# Patient Record
Sex: Male | Born: 1955
Health system: Southern US, Community
[De-identification: ages and names within clinical notes are randomized; demographics above are authoritative.]

## PROBLEM LIST (undated history)

## (undated) DIAGNOSIS — M25569 Pain in unspecified knee: Secondary | ICD-10-CM

## (undated) DIAGNOSIS — S42109A Fracture of unspecified part of scapula, unspecified shoulder, initial encounter for closed fracture: Secondary | ICD-10-CM

## (undated) DIAGNOSIS — S2249XA Multiple fractures of ribs, unspecified side, initial encounter for closed fracture: Secondary | ICD-10-CM

## (undated) DIAGNOSIS — G8929 Other chronic pain: Secondary | ICD-10-CM

## (undated) DIAGNOSIS — R972 Elevated prostate specific antigen [PSA]: Secondary | ICD-10-CM

## (undated) DIAGNOSIS — S2239XA Fracture of one rib, unspecified side, initial encounter for closed fracture: Secondary | ICD-10-CM

## (undated) DIAGNOSIS — J939 Pneumothorax, unspecified: Secondary | ICD-10-CM

## (undated) HISTORY — DX: Fracture of one rib, unspecified side, initial encounter for closed fracture: S22.39XA

## (undated) HISTORY — DX: Multiple fractures of ribs, unspecified side, initial encounter for closed fracture: S22.49XA

## (undated) HISTORY — DX: Pain in unspecified knee: M25.569

## (undated) HISTORY — DX: Elevated prostate specific antigen (PSA): R97.20

## (undated) HISTORY — PX: KNEE SURGERY: SHX244

## (undated) HISTORY — DX: Other chronic pain: G89.29

## (undated) HISTORY — DX: Pneumothorax, unspecified: J93.9

## (undated) HISTORY — DX: Fracture of unspecified part of scapula, unspecified shoulder, initial encounter for closed fracture: S42.109A

---

## 2000-10-28 ENCOUNTER — Encounter: Payer: Self-pay | Admitting: Orthopedic Surgery

## 2000-10-29 ENCOUNTER — Inpatient Hospital Stay (HOSPITAL_COMMUNITY): Admission: RE | Admit: 2000-10-29 | Discharge: 2000-10-30 | Payer: Self-pay | Admitting: Orthopedic Surgery

## 2005-06-19 ENCOUNTER — Ambulatory Visit: Payer: Self-pay | Admitting: Family Medicine

## 2006-05-05 ENCOUNTER — Inpatient Hospital Stay (HOSPITAL_COMMUNITY): Admission: EM | Admit: 2006-05-05 | Discharge: 2006-05-07 | Payer: Self-pay | Admitting: Emergency Medicine

## 2007-09-21 ENCOUNTER — Encounter: Payer: Self-pay | Admitting: Family Medicine

## 2007-09-30 ENCOUNTER — Ambulatory Visit: Payer: Self-pay | Admitting: Family Medicine

## 2007-09-30 DIAGNOSIS — J301 Allergic rhinitis due to pollen: Secondary | ICD-10-CM

## 2007-09-30 DIAGNOSIS — M1710 Unilateral primary osteoarthritis, unspecified knee: Secondary | ICD-10-CM | POA: Insufficient documentation

## 2007-10-22 ENCOUNTER — Ambulatory Visit: Payer: Self-pay | Admitting: Family Medicine

## 2007-10-22 LAB — CONVERTED CEMR LAB
OCCULT 2: NEGATIVE
OCCULT 3: NEGATIVE

## 2007-10-26 ENCOUNTER — Encounter: Payer: Self-pay | Admitting: Family Medicine

## 2008-02-17 IMAGING — CR DG CHEST 1V PORT
1 series · 1 of 1 positions shown · non-contrast
Comparison: none

CLINICAL DATA: Fall.  Shortness of breath and left-sided pain.
PORTABLE CHEST - 1 VIEW - 05/05/06 AT 7187 HOURS:

[view not recorded]
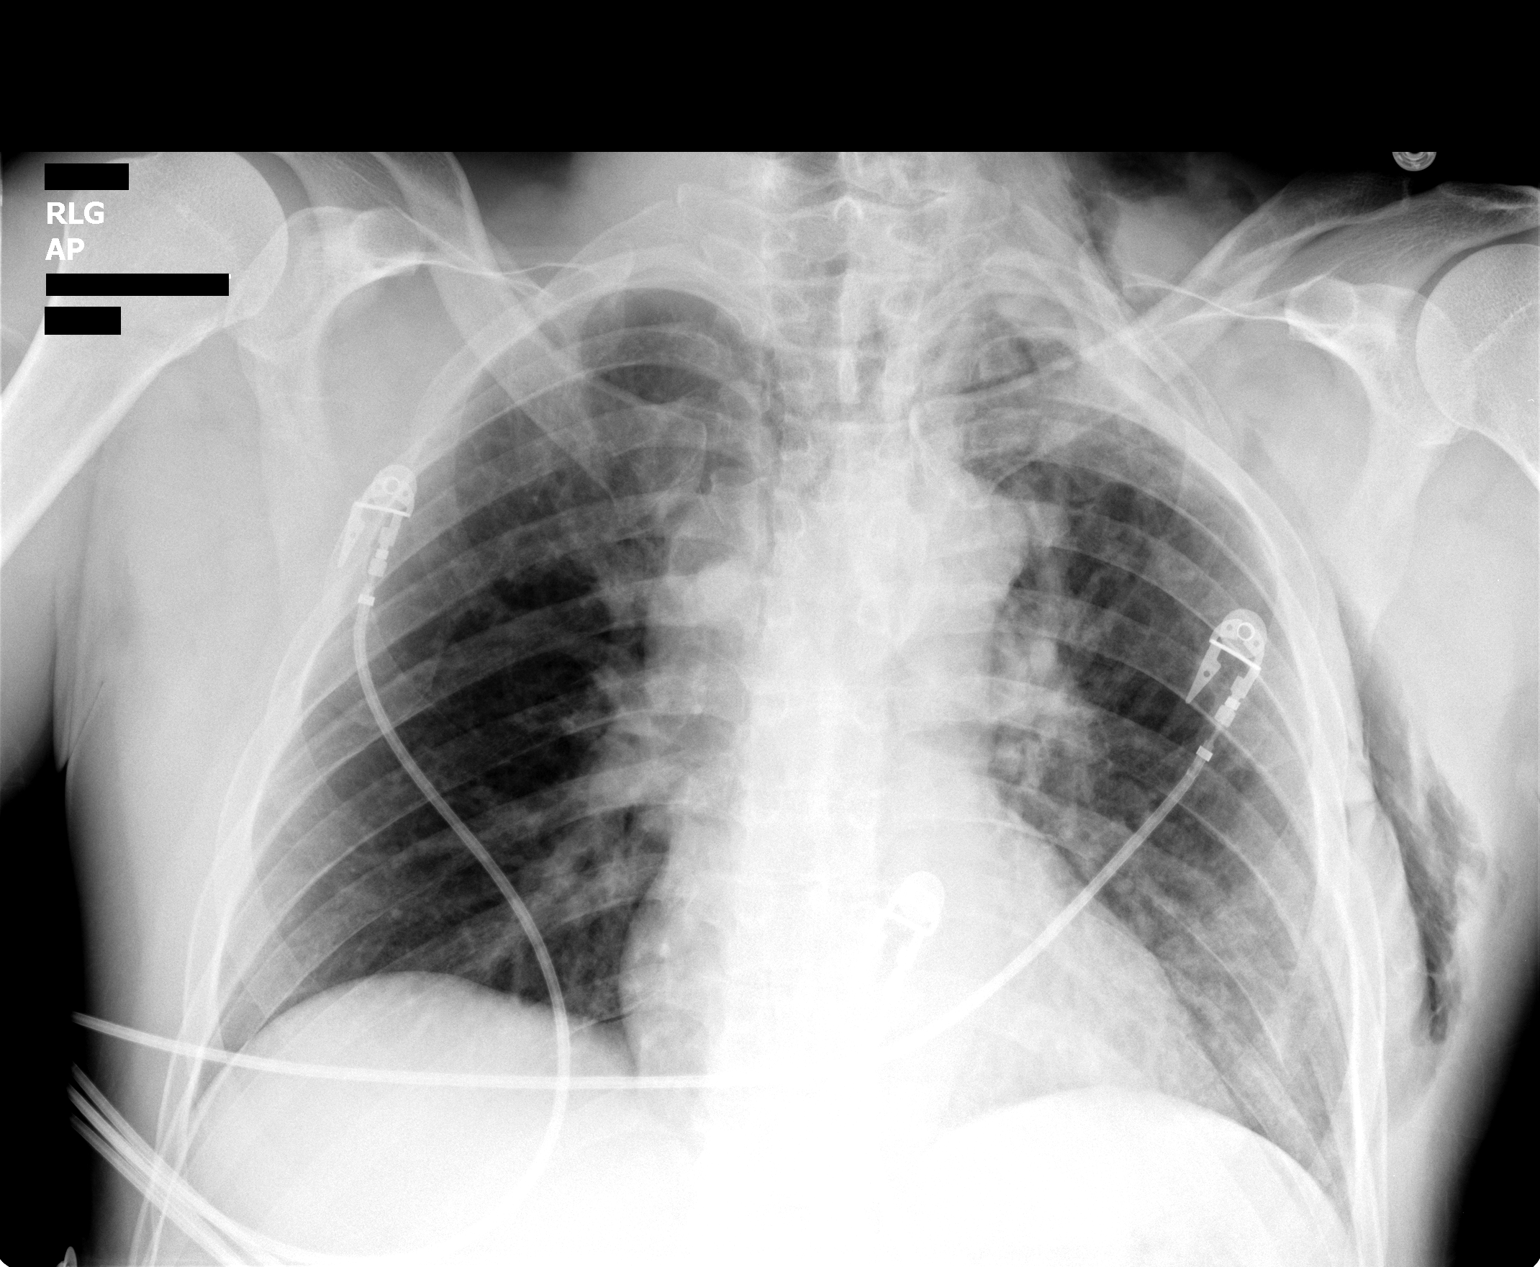

[1 of 1 positions shown; findings below may reference images not displayed]

FINDINGS: The patient has been removed from the trauma backboard.  Again noted is a large amount of left subcutaneous gas and left rib fractures.  A large pneumothorax is not appreciated.  There is density in the left lung apex probably related to some fluid.  The mediastinum has somewhat of an indistinct appearance and it is difficult to exclude a mediastinal injury.  Subcutaneous gas extends into the left side of the neck.
The right lung remains clear.  The heart size is stable.
IMPRESSION: Post-traumatic changes along the left hemithorax consistent with rib fractures with a large amount of subcutaneous gas.  In addition, there is density in the left apex thought to be related to pleural fluid.  There is slight widening of the superior mediastinum and a mediastinal hematoma cannot be excluded.  Recommend further evaluation with chest CT with contrast.

## 2008-02-17 IMAGING — CT CT ABDOMEN W/ CM
3 of 7 series · 11 of 32 positions shown, 16 images · IV contrast (100 ML OMNI 300)
Comparison: none

CLINICAL DATA: Fall with chest trauma.
CHEST CT WITH CONTRAST ? 05/05/06:
TECHNIQUE: Multidetector CT imaging of the chest was performed following the standard protocol during bolus administration of intravenous contrast.   
Contrast:  100 cc Omnipaque 300 IV.
TECHNIQUE: Multidetector CT imaging of the abdomen was performed following the standard protocol during bolus administration of intravenous contrast.
TECHNIQUE: Multidetector CT imaging of the pelvis was performed following the standard protocol during bolus administration of intravenous contrast.

[Series 400: reformatted · sagittal · 0.86mm/px · 4 of 148 slices shown, 9 images (1 of 3)]
[im 30/148  soft-tissue]
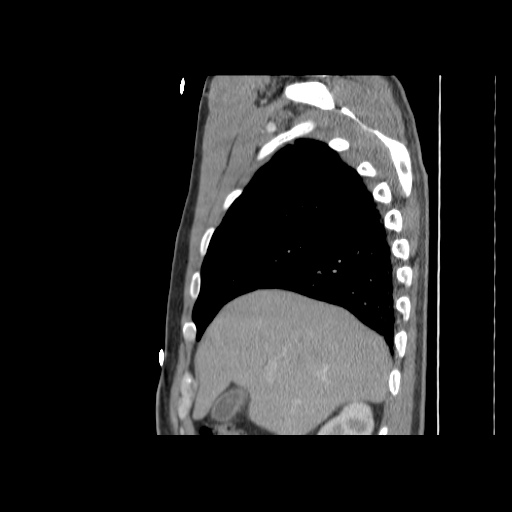
[im 30/148  lung]
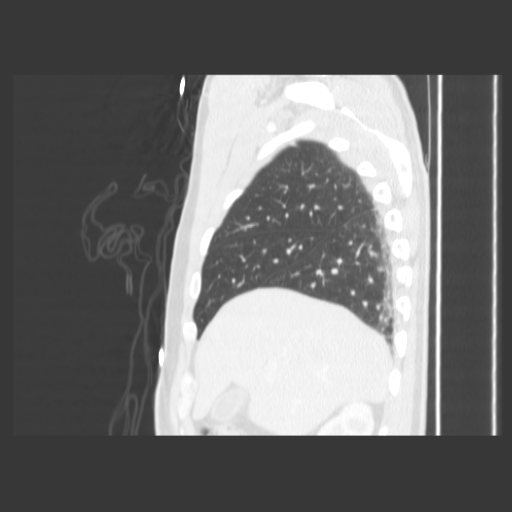
[im 30/148  bone]
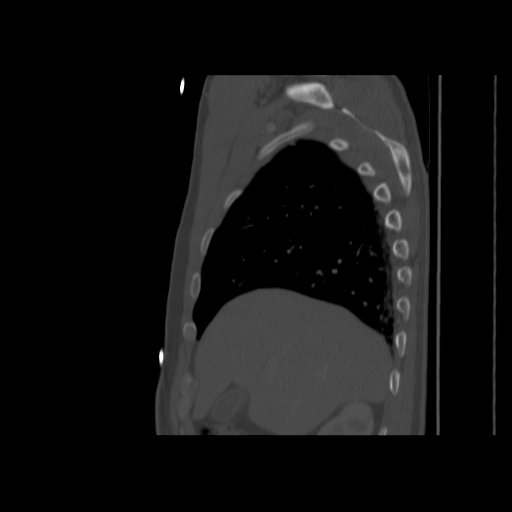
[im 59/148  soft-tissue]
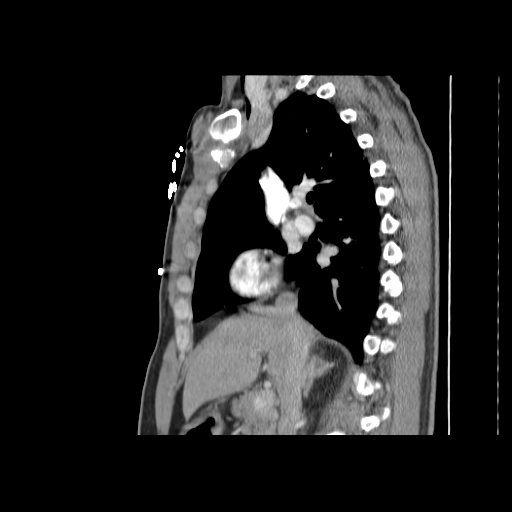
[im 59/148  lung]
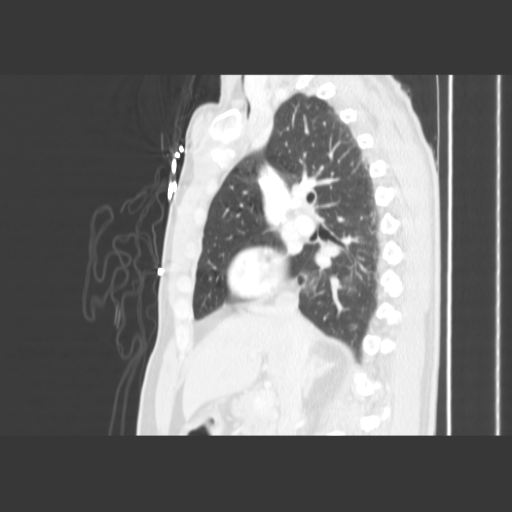
[im 89/148  soft-tissue]
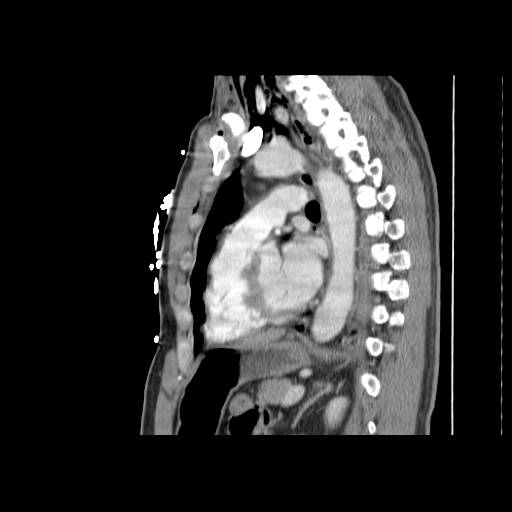
[im 89/148  lung]
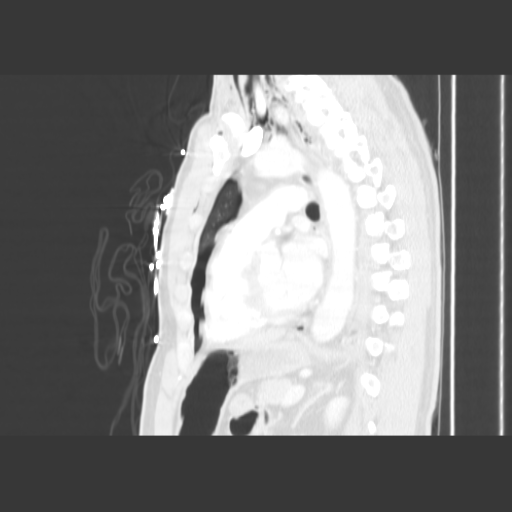
[im 118/148  soft-tissue]
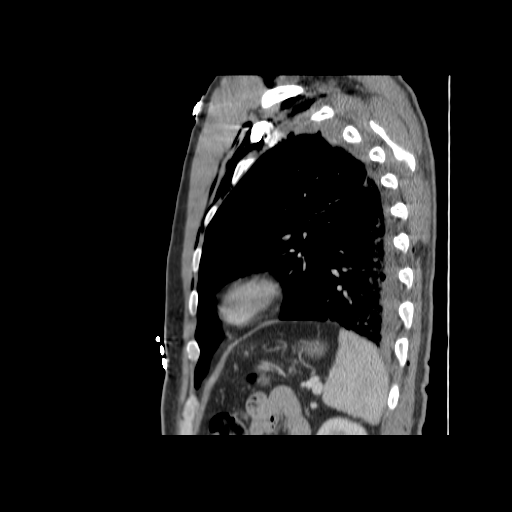
[im 118/148  lung]
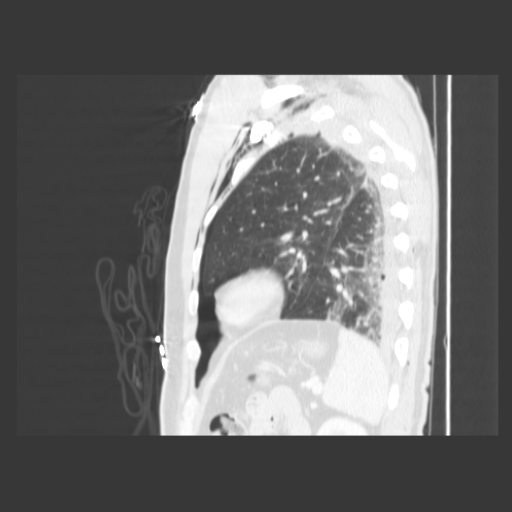

[Series 401: reformatted · coronal · 0.86mm/px · 3 of 117 slices shown (2 of 3)]
[im 30/117  soft-tissue]
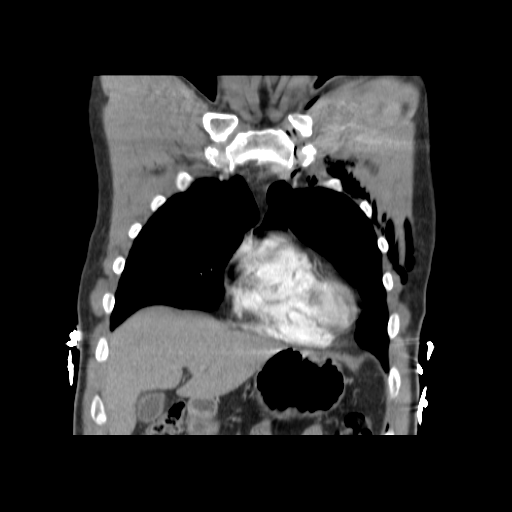
[im 59/117  soft-tissue]
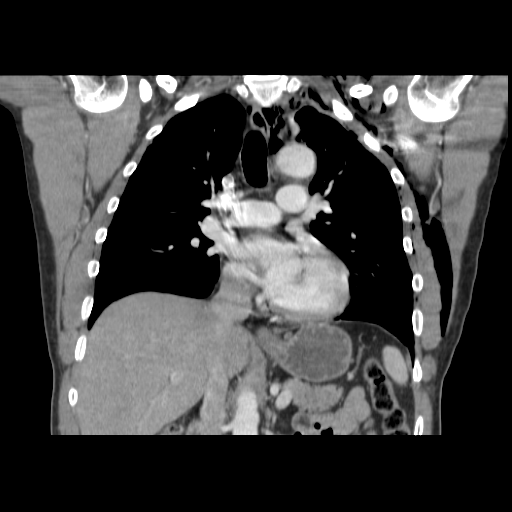
[im 88/117  soft-tissue]
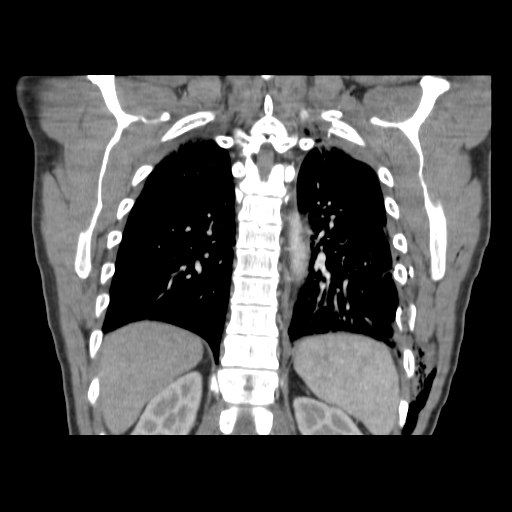

[Series 402: reformatted · sagittal · 0.86mm/px · 4 of 148 slices shown (3 of 3)]
[im 30/148  soft-tissue]
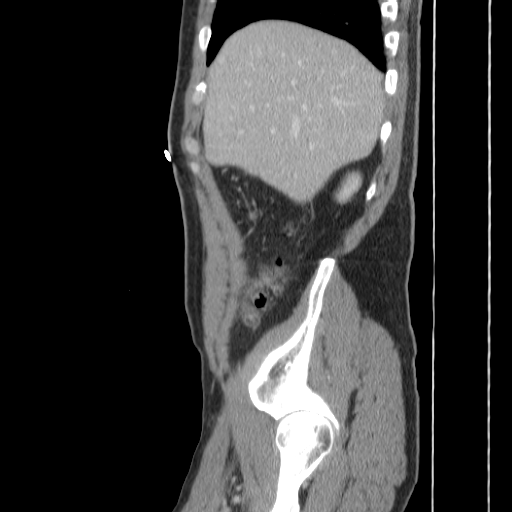
[im 59/148  soft-tissue]
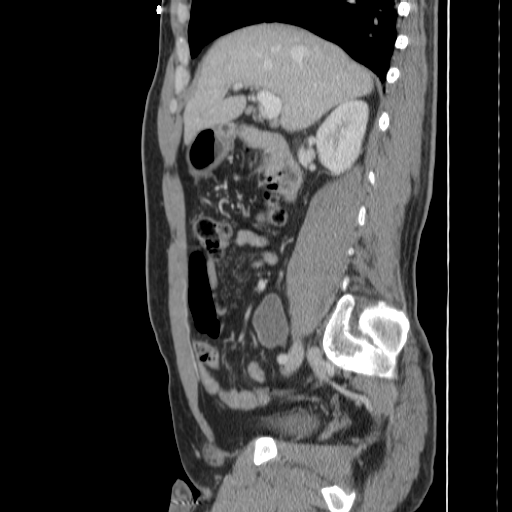
[im 89/148  soft-tissue]
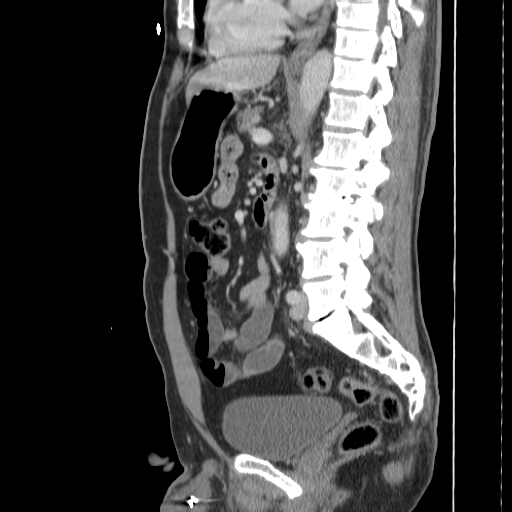
[im 118/148  soft-tissue]
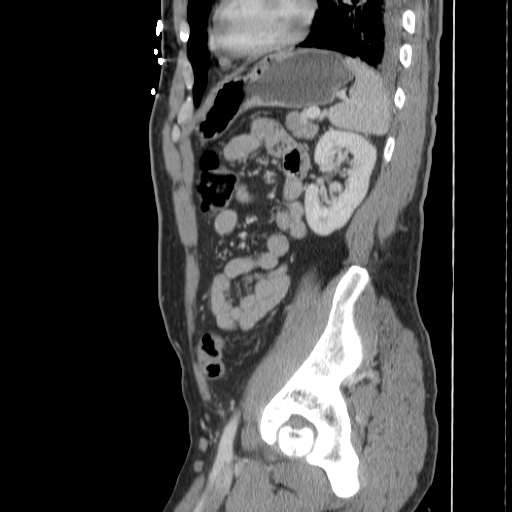

[11 of 32 positions shown; findings below may reference images not displayed]

FINDINGS: There is a large amount of subcutaneous gas similar to the recent chest radiographs.  There is no evidence for a mediastinal hematoma.  There is a small amount of fluid in the superior pericardial recess.  Incidentally, the left vertebral artery originates from the arch which is a normal variant.  There is normal caliber of the pulmonary arteries bilaterally.  There is a small amount of left pleural fluid.  There are fractures involving the left second, third, fourth, sixth, seventh, eighth, and probably ninth rib.  
Lung windows demonstrate a large amount of subcutaneous as well as mediastinal gas.  There is a small anterior pneumothorax in the left midlung as well as the lingular region.  There is volume loss involving the left lower lobe.  There is a focal area of air space disease in the anterior left upper lung on sequence 3, image #22 with central area of cavitation.  The finding is concerning for a contusion with an additional small area along the periphery of the left upper lung.  There is a punctate 2-3 mm nodular density in the right lung on sequence, image #24.  There is a fracture involving the left scapular body which is minimally displaced.  The left glenohumeral joint is intact.  No acute vertebral abnormalities.  The sternum is intact.
IMPRESSION: 1.  Extensive trauma to the left hemithorax.  The patient has multiple left rib fractures as well as a left scapular fracture.  Extensive subcutaneous and mediastinal gas.  There is a small anterior pneumothorax as well.  There is a small amount of fluid in the mediastinum thought to be related to pericardial recess and no evidence for mediastinal hematoma.
2.  Focal air space density with a central area of lucency in the left upper lung.  This is most likely post-traumatic in nature but recommend a follow-up CT in a few weeks to exclude an underlying cavitary lesion in this area.
ABDOMEN CT WITH CONTRAST ? 05/05/06:
FINDINGS: The liver, spleen, pancreas, adrenal glands, kidneys, and stomach have a normal appearance.  There are some densities within the gallbladder and cannot exclude stones or sludge.  There is no evidence for gallbladder distention. The portal venous system is patent.  Incidentally, the patient has a left circumaortic renal vein.  Small and large bowel structures are within normal limits.  No significant mesenteric injury is appreciated.  No acute bone abnormalities.
IMPRESSION: No acute findings in the abdomen.
PELVIS CT WITH CONTRAST ? 05/05/06:
FINDINGS: The urinary bladder is distended.  Calcifications associated with the prostate gland.  Normal appearance of the distal colon.  No significant free fluid or lymphadenopathy.  There are no acute bone abnormalities.  There is calcification or high-density structure in the anterior aspect of the thigh which is indeterminate.
IMPRESSION: No acute findings in the pelvis.

## 2009-09-25 ENCOUNTER — Ambulatory Visit: Payer: Self-pay | Admitting: Family Medicine

## 2009-10-02 ENCOUNTER — Ambulatory Visit: Payer: Self-pay | Admitting: Family Medicine

## 2009-10-02 LAB — CONVERTED CEMR LAB
AST: 21 units/L (ref 0–37)
Albumin: 4 g/dL (ref 3.5–5.2)
BUN: 17 mg/dL (ref 6–23)
CO2: 30 meq/L (ref 19–32)
Calcium: 8.9 mg/dL (ref 8.4–10.5)
Chloride: 105 meq/L (ref 96–112)
Creatinine, Ser: 0.9 mg/dL (ref 0.4–1.5)
GFR calc non Af Amer: 99.26 mL/min (ref >60)
Glucose, Bld: 103 mg/dL — ABNORMAL HIGH (ref 70–99)
HCT: 44.3 % (ref 39.0–52.0)
Hemoglobin: 15.3 g/dL (ref 13.0–17.0)
MCV: 97.1 fL (ref 78.0–100.0)
Monocytes Absolute: 0.6 10*3/uL (ref 0.1–1.0)
Monocytes Relative: 13.4 % — ABNORMAL HIGH (ref 3.0–12.0)
Neutro Abs: 2.1 10*3/uL (ref 1.4–7.7)
Neutrophils Relative %: 43.7 % (ref 43.0–77.0)
Platelets: 175 10*3/uL (ref 150.0–400.0)
Potassium: 5 meq/L (ref 3.5–5.1)
Protein, U semiquant: NEGATIVE
Sodium: 140 meq/L (ref 135–145)
Specific Gravity, Urine: 1.02
TSH: 2.12 microintl units/mL (ref 0.35–5.50)
Total Bilirubin: 1.3 mg/dL — ABNORMAL HIGH (ref 0.3–1.2)
Triglycerides: 87 mg/dL (ref 0.0–149.0)
Urobilinogen, UA: 0.2
VLDL: 17.4 mg/dL (ref 0.0–40.0)
pH: 8.5

## 2010-06-02 LAB — CONVERTED CEMR LAB
AST: 22 units/L (ref 0–37)
Alkaline Phosphatase: 76 units/L (ref 39–117)
BUN: 21 mg/dL (ref 6–23)
Basophils Absolute: 0 10*3/uL (ref 0.0–0.1)
Basophils Relative: 0.6 % (ref 0.0–1.0)
Bilirubin Urine: NEGATIVE
Calcium: 9.1 mg/dL (ref 8.4–10.5)
Chloride: 104 meq/L (ref 96–112)
Creatinine, Ser: 0.9 mg/dL (ref 0.4–1.5)
Eosinophils Absolute: 0.1 10*3/uL (ref 0.0–0.7)
Eosinophils Relative: 1.6 % (ref 0.0–5.0)
Glucose, Bld: 98 mg/dL (ref 70–99)
Glucose, Urine, Semiquant: NEGATIVE
HCT: 44.1 % (ref 39.0–52.0)
Hemoglobin: 14.8 g/dL (ref 13.0–17.0)
MCHC: 33.6 g/dL (ref 30.0–36.0)
MCV: 97.2 fL (ref 78.0–100.0)
Monocytes Absolute: 0.6 10*3/uL (ref 0.1–1.0)
Monocytes Relative: 13.5 % — ABNORMAL HIGH (ref 3.0–12.0)
Neutro Abs: 2.3 10*3/uL (ref 1.4–7.7)
Neutrophils Relative %: 46.2 % (ref 43.0–77.0)
Nitrite: NEGATIVE
PSA: 0.71 ng/mL (ref 0.10–4.00)
RDW: 11.5 % (ref 11.5–14.6)
Specific Gravity, Urine: 1.01
Total Bilirubin: 1.3 mg/dL — ABNORMAL HIGH (ref 0.3–1.2)
Total CHOL/HDL Ratio: 3.7
Triglycerides: 33 mg/dL (ref 0–149)
Urobilinogen, UA: NEGATIVE
pH: 5

## 2010-06-04 NOTE — Assessment & Plan Note (Signed)
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 Colonoscopic exam in 2007 to be repeated in 10 years immunizations are up-to-date no cardiac symptoms has some past history of allergic rhinitis early with his Allegra  EKG  Procedure date:  10/02/2009  Findings:      sinus rhythm with rate of:  63 Sinus Rhythm Normal ECG   Allergies: 1)  ! Sulfa  Past History:  Past Medical History: Last updated: 09/30/2007 rt knee problems  left rib fractures  small left pneumothorax  left scapula fracture  Past Surgical History: Last updated: 09/30/2007 rt knee surgery --dr frank aluisio  Social History: Last updated: 09/30/2007 Occupation: general contractor   Risk Factors: Smoking Status: never (09/30/2007)  Review of Systems      See HPI General:  Denies chills, fatigue, fever, loss of appetite, malaise, sleep disorder, sweats, weakness, and weight loss. Eyes:  Denies blurring, discharge, double vision, eye irritation, eye pain, halos, itching, light sensitivity, red eye, vision loss-1 eye, and vision loss-both eyes; classes. ENT:  Denies decreased hearing, difficulty swallowing, ear discharge, earache, hoarseness, nasal congestion, nosebleeds, postnasal drainage, ringing in ears, sinus pressure, and sore throat. CV:  Denies bluish discoloration of lips or nails, chest pain or  discomfort, difficulty breathing at night, difficulty breathing while lying down, fainting, fatigue, leg cramps with exertion, lightheadness, near fainting, palpitations, shortness of breath with exertion, swelling of feet, swelling of hands, and weight gain. Resp:  Denies chest discomfort, chest pain with inspiration, cough, coughing up blood, excessive snoring, hypersomnolence, morning headaches, pleuritic, shortness of breath, sputum productive, and wheezing. GI:  Denies abdominal pain, bloody stools, change in bowel habits, constipation, dark tarry stools, diarrhea, excessive appetite, gas, hemorrhoids, indigestion, loss of appetite, nausea, vomiting, vomiting blood, and yellowish skin color. GU:  Denies decreased libido, discharge, dysuria, erectile dysfunction, genital sores, hematuria, incontinence, nocturia, urinary frequency, and urinary hesitancy. MS:  Complains of joint pain; right knee partial replacement some arthritic symptoms. Derm:  Denies changes in color of skin, changes in nail beds, dryness, excessive perspiration, flushing, hair loss, insect bite(s), itching, lesion(s), poor wound healing, and rash. Neuro:  Denies brief paralysis, difficulty with concentration, disturbances in coordination, falling down, headaches, inability to speak, memory loss, numbness, poor balance, seizures, sensation of room spinning, tingling, tremors, visual disturbances, and weakness. Psych:  Denies alternate hallucination ( auditory/visual), anxiety, depression, easily angered, easily tearful, irritability, mental problems, panic attacks, sense of great danger, suicidal thoughts/plans, thoughts of violence, unusual visions or sounds, and thoughts /plans of harming others.  Physical Exam  General:  Well-developed,well-nourished,in no acute distress; alert,appropriate and cooperative throughout examination Head:  Normocephalic and atraumatic without obvious abnormalities. No apparent alopecia or  balding.  Eyes:  No corneal or conjunctival inflammation noted. EOMI. Perrla. Funduscopic exam benign, without hemorrhages, exudates or papilledema. Vision grossly normal. Ears:  External ear exam shows no significant lesions or deformities.  Otoscopic examination reveals clear canals, tympanic membranes are intact bilaterally without bulging, retraction, inflammation or discharge. Hearing is grossly normal bilaterally. Nose:  External nasal examination shows no deformity or inflammation. Nasal mucosa are pink and moist without lesions or exudates. Mouth:  Oral mucosa and oropharynx without lesions or exudates.  Teeth in good repair. Neck:  No deformities, masses, or tenderness noted. Chest Wall:  No deformities, masses, tenderness or gynecomastia noted. Breasts:  No masses or gynecomastia noted Lungs:  Normal respiratory effort, chest expands symmetrically. Lungs are clear to auscultation, no crackles or wheezes. Heart:  Normal rate and regular rhythm. S1 and S2 normal without gallop, murmur, click, rub or other extra sounds. Abdomen:  Bowel sounds positive,abdomen soft and non-tender without masses, organomegaly or hernias noted. Rectal:  No external abnormalities noted. Normal sphincter tone. No rectal masses or tenderness. Genitalia:  Testes bilaterally descended without nodularity, tenderness or masses. No scrotal masses or lesions. No penis lesions or urethral discharge. Prostate:  Prostate gland firm and smooth, no enlargement, nodularity, tenderness, mass, asymmetry or induration. Msk:  operative scar on the right knee but no tenderness at this time no limitation of movement Pulses:  R and L carotid,radial,femoral,dorsalis pedis and posterior tibial pulses are full and equal bilaterally Extremities:  No clubbing, cyanosis, edema, or deformity noted with normal full range of motion of all joints.   Neurologic:  No cranial nerve deficits noted. Station and gait are normal. Plantar reflexes are  down-going bilaterally. DTRs are symmetrical throughout. Sensory, motor and coordinative functions appear intact. Skin:  Intact without suspicious lesions or rashes Cervical Nodes:  No lymphadenopathy noted Axillary Nodes:  No palpable lymphadenopathy Inguinal Nodes:  No significant adenopathy Psych:  Cognition and judgment appear intact. Alert and cooperative with normal attention span and concentration. No apparent delusions, illusions, hallucinations   Impression & Recommendations:  Problem # 1:  PHYSICAL EXAMINATION (ICD-V70.0) Assessment Improved  Problem # 2:  DEGENERATIVE JOINT DISEASE, RIGHT KNEE (ICD-715.96) Assessment: Improved  His updated medication list for this problem includes:    Diclofenac Sodium 75 Mg Tbec (Diclofenac sodium) .Marland Kitchen... 1  two times a day for arthritis    Baby Aspirin 81 Mg Chew (Aspirin)  Problem # 3:  ALLERGIC RHINITIS DUE TO POLLEN (ICD-477.0) Assessment: Improved  Complete Medication List: 1)  Diclofenac Sodium 75 Mg Tbec (Diclofenac sodium) .Marland Kitchen.. 1  two times a day for arthritis 2)  Baby Aspirin 81 Mg Chew (Aspirin) 3)  Fish Oil 1000 Mg Caps (Omega-3 fatty acids) .... Once daily 4)  Allegra 180 Mg Tabs (Fexofenadine hcl) .Marland Kitchen.. 1 qd  Patient Instructions: 1)  physical examination reveals no abnormalities except for the past history of partial knee replacement on the right 2)  Usual laboratory allergic rhinitis when needed 3)  Continue to take diclofenac 75 mg A.m. and p.m. 4)  Continued daily aspirin 81 mg 5)  as related to use the laboratory studies were all within normal limits 6)

## 2010-09-20 NOTE — Op Note (Signed)
[facility]  Patient:    [name], [name]                        MRN: [id number] Proc. Date: 10/28/00 Adm. Date:  [id number] Attending:  Ollen Gross V                           Operative Report  PREOPERATIVE DIAGNOSIS:  Medial compartment arthritis, right knee.  POSTOPERATIVE DIAGNOSIS:  Medial compartment arthritis, right knee.  PROCEDURE:  Right knee arthroscopy with insertion of unispacer.  SURGEON:  Dr. Lequita Halt.  ASSISTANT:  Haze Rushing, P.A.-C.  ANESTHESIA:  General.  ESTIMATED BLOOD LOSS:  100  DRAINS:  Hemovac x 1.  COMPLICATIONS:  None.  TOURNIQUET TIME:  75 minutes at 350 mmHg.  CONDITION:  Stable to recovery.  BRIEF CLINICAL NOTE:  [name] is a 55 year old male with significant medial compartment arthritis of the right knee with intact ACL and uninvolved lateral and patellofemoral compartments. He has got pain refractory to nonoperative management and presents now for the above mentioned procedure.  DESCRIPTION OF PROCEDURE:  After successful administration of general anesthetic, a tourniquet was placed high on the right thigh and right lower extremity prepped and draped in the usual sterile fashion. The standard superomedial and inferolateral portal incisions are made, inflow cannula passed superomedially and the cannula for the camera inferolaterally. Arthroscopic visualization proceeds. He had a lot of synovitis and inflamed tissue. It was felt at this point for better visualization that we would inflate the tourniquet. The leg was elevated and the tourniquet was inflated to 350 mmHg. The undersurface of the patella was then visualized as well as the trochlea and both appeared normal. The medial and lateral gutters looked normal. Flexion and valgus force is applied to the knee and the medial compartment entered. He had a small meniscal remnant but had bone on bone change throughout the medial femoral condyle and medial tibial  plateau. There was minimal remaining cartilage. The intercondylar notch is then visualized and the ACL appears and probes normally. The lateral compartment is entered and looks normal. At this time, the remaining cartilage on the surface of the tibia is removed with a wooden handle periosteal elevator. The femur is also smoothed with the shaver and elevator. The oblator is then used to remove the meniscal remnant. The arthroscopic equipment was then removed from the joint and medial parapatellar incision made from the inferior pole of patella down medial to the tibial tubercle. The skin was cut with a 10 blade in the subcutaneous tissue and then an arthrotomy is made. The soft tissue over the proximal and medial tibial is elevated to the joint line with the knife and at the semimembranosus bursa with the curved osteotome. Exposure was obtained and then the marginal osteophytes removed off the tibia as well as femur. The remaining cartilage is then smoothed down to the bony surface and all roughened edges are smoothed to create a nice gliding surface on both the femur and the tibia. Once its felt that the cartilage has been appropriately contoured then the device is trialed. The depth gauge is used to measure from posterior to anterior tibia and its 50 to 51 mm. We trial a 50 first with a 3 mm thickness. It was noted on the lateral fluoroscopy that this was not long enough thus we went to a 54 x 3. This has a great fit. I achieved full  extension and I was able to flex him down to 135. He had excellent stability. It was gliding smoothly and there was no "bump" shift. The cartilage was thus well molded. At this point, the trial is removed and the wound copiously irrigated as was the joint copiously irrigated with antibiotic solution. A permanent 54 mm unispacer with 3 mm thickness is then placed into the medial compartment of the knee. We placed it through a range of motion and did an anterior  posterior stability test and it was found to be very stable. We then viewed it under AP fluoroscopy which showed a great fit and then lateral fluoroscopy which showed it was tracking very well at the femur. There was no evidence of any subluxation or abnormal jumping phenomenon. The wound is again copiously irrigated with antibiotic solution and the arthrotomy closed over a Hemovac drain with interrupted #1 PDS. The tourniquet was released with a total time of 75 minutes. The subcu was closed with interrupted 2-0 Vicryl, subcuticular with running 4-0 monocryl. The incision was clean and dry and Steri-Strips. A bulky sterile dressing applied. Prior to this, the subcu had been infiltrated with 10 cc of 0.25% Marcaine with epinephrine and the portals were closed with nylon. After the bulky sterile dressing was applied, drains hooked to suction, patient awakened and placed into a knee immobilizer and transported to recovery in stable condition.DD:  10/28/00 TD:  10/28/00 Job: [id number]

## 2010-09-20 NOTE — Discharge Summary (Signed)
[name], [name] NO.:  [id number]   MEDICAL RECORD NO.:  [id number]          PATIENT TYPE:  INP   LOCATION:  5004                         FACILITY:  [facility]   PHYSICIAN:  Cherylynn Ridges, M.D.    DATE OF BIRTH:  07/07/1955   DATE OF ADMISSION:  05/05/2006  DATE OF DISCHARGE:  05/07/2006                               DISCHARGE SUMMARY   DISCHARGE DIAGNOSES:  1. Fall.  2. Multiple left-sided rib fractures.  3. Left pneumothorax.  4. Left chest subcutaneous emphysema.  5. Acute blood loss anemia.  6. Left scapula fracture   CONSULTANTS:  Lubertha Basque. Jerl Santos, M.D., for orthopedic surgery.   PROCEDURES:  None.   HISTORY OF PRESENT ILLNESS:  This is a 55 year old white male who fell  from a scaffold approximately 8 feet onto the ground.  There was no loss  of consciousness.  He comes in as a non-trauma code complaining of left-  sided chest pain.  His workup demonstrated the above-mentioned  orthopedic injuries, and he was admitted for observation and pain  control.   HOSPITAL COURSE:  The patient's hospital course was uneventful.  He had  significantly less pain from his fractures as was expected and was able  to be controlled with oral pain medication and sent home in good  condition in the care of his wife.  His scapular fracture was treated  nonoperatively in a sling.  The patient expressed a desire to see his  own orthopedic surgeon upon discharge.   DISCHARGE MEDICATIONS:  Percocet 5/325 take one to two p.o. q.4h p.r.n.  pain, #60 with no refill.   FOLLOW-UP:  The patient will follow up with Dr. Lequita Halt within1-2 weeks.  Follow-up with the trauma service will be on an as-needed basis.  If he  has any questions or concerns, he should feel free to call us.      Earney Hamburg, P.A.      Cherylynn Ridges, M.D.  Electronically Signed    MJ/MEDQ  D:  05/07/2006  T:  05/07/2006  Job:  [id number]   cc:   Ollen Gross, M.D.  Lubertha Basque Jerl Santos, M.D.

## 2010-09-20 NOTE — H&P (Signed)
[name], [name] NO.:  [id number]   MEDICAL RECORD NO.:  [id number]          PATIENT TYPE:  EMS   LOCATION:  MAJO                         FACILITY:  [facility]   PHYSICIAN:  Gabrielle Dare. Janee Morn, M.D.DATE OF BIRTH:  25-Feb-1956   DATE OF ADMISSION:  05/05/2006  DATE OF DISCHARGE:                              HISTORY & PHYSICAL   CHIEF COMPLAINT:  Left chest pain and left back pain status post fall.   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  He fell approximately 8 feet onto the ground.  He had  no loss of consciousness.  He came as a non-trauma code activation.  He  complains of left lateral chest pain and pain in his left back.  Workup  in the emergency department shows multiple left rib fractures, a small  left pneumothorax on CT only, and a left scapula fracture.  We are asked  to admit to the trauma service.   PAST MEDICAL HISTORY:  Right knee problems following an injury during  football in his youth.   PAST SURGICAL HISTORY:  Right knee surgery x2 by Dr. Ollen Gross.   SOCIAL HISTORY:  Does not do illicit drugs.  He does not smoke.  He  drinks one glass of wine daily.   ALLERGIES:  SULFA.   MEDICATIONS:  1. Diclofenac.  2. aspirin 81 mg daily.  3. Fish oil supplement.   PRIMARY MEDICAL DOCTOR:  Dr. Cecille Rubin.   REVIEW OF SYSTEMS:  A 15 system review was completed and is negative  with the exception of the following.  PULMONARY EXAM:  He has some pain  with deep inspiration along his left chest.  MUSCULOSKELETAL EXAM:  Has  the left chest discomfort and left back discomfort.   PHYSICAL EXAM:  VITAL SIGNS:  Temperature 96.9, pulse 58, respirations  20, blood pressure 119/83, saturations 91-98% on room air.  SKIN:  Warm and dry with no rashes.  HEENT:  Normocephalic and atraumatic.  Eyes: Pupils are 3 mm and equal  and  reactive.  His left iris primarily in the area inferior to his pupil  has a discoloration, the patient claims this has been present since  birth.  Ears are clear.  Face is atraumatic.  NECK:  Supple with no tenderness, there is no step-offs.  No masses are  felt.  LUNGS:  Clear to auscultation.  Respiratory excursion is decent.  He has  some tenderness along his left lateral rib area with some mild  subcutaneous emphysema.  CARDIOVASCULAR EXAM:  Heart is regular, rate is approximately 60, distal  pulses are 2+ throughout with no peripheral edema. ABDOMEN:  Soft and  nontender.  Bowel sounds are present.  No organomegaly or masses are  noted on palpation.  Pelvis is stable anteriorly.  MUSCULOSKELETAL EXAM:  Shows no significant bony deformity or tenderness  in the extremities.  Back has some tenderness over his left scapula but  no tenderness  along the midline or step-offs and along the spine.  NEUROLOGIC EXAM:  Glasgow coma scale is 15.  He is moving all  extremities well.  Left leg touch is intact grossly in all four  extremities.   LABORATORY STUDIES:  Sodium 138, potassium 3.9, chloride 104, CO2 29,  BUN 14, creatinine 1, glucose 130.  White blood cell count 13.8,  hemoglobin 14.2, platelets 168,000.  Urinalysis negative.  CHEST X-RAY:  Shows multiple left rib fractures with no pneumothorax seen.  CT scan of  the chest and pelvis demonstrates multiple left rib fractures with a  small left pneumothorax and left upper lobe pulmonary contusion.  There  is a left scapula fracture.  The abdomen and pelvis CT demonstrates no  injuries to abdominal organs, no free fluid.   IMPRESSION:  55 year old white male status post fall.  1. Multiple left rib fractures.  2. Tiny left pneumothorax.  3. Left scapula fracture.   PLAN:  Plan will be to admit to step-down unit on the trauma service.  We will focus on pulmonary toilet and PCA for pain control.  Will check  follow-up chest x-ray and I  have notified him that if his pneumothorax  worsens he will need a chest tube.  We have also obtained orthopedics  consult and I discuss this with Dr. Jerl Santos.  Questions were answered.  This primary plan was discussed with the patient.  Questions were  answered.      Gabrielle Dare Janee Morn, M.D.  Electronically Signed     BET/MEDQ  D:  05/05/2006  T:  05/05/2006  Job:  [id number]   cc:   Lubertha Basque. Jerl Santos, M.D.  Ellin Saba., MD

## 2010-12-24 ENCOUNTER — Other Ambulatory Visit: Payer: Self-pay | Admitting: Orthopedic Surgery

## 2010-12-24 ENCOUNTER — Encounter (HOSPITAL_COMMUNITY): Payer: BC Managed Care – PPO

## 2010-12-24 LAB — CBC
HCT: 40.5 % (ref 39.0–52.0)
MCH: 32.4 pg (ref 26.0–34.0)
MCHC: 35.6 g/dL (ref 30.0–36.0)
MCV: 91.2 fL (ref 78.0–100.0)
RBC: 4.44 MIL/uL (ref 4.22–5.81)

## 2010-12-24 LAB — COMPREHENSIVE METABOLIC PANEL
ALT: 25 U/L (ref 0–53)
AST: 18 U/L (ref 0–37)
Alkaline Phosphatase: 62 U/L (ref 39–117)
Creatinine, Ser: 0.86 mg/dL (ref 0.50–1.35)
GFR calc Af Amer: >60 mL/min (ref >60)
Sodium: 140 mEq/L (ref 135–145)
Total Bilirubin: 0.4 mg/dL (ref 0.3–1.2)
Total Protein: 7 g/dL (ref 6.0–8.3)

## 2010-12-24 LAB — URINALYSIS, ROUTINE W REFLEX MICROSCOPIC
Glucose, UA: NEGATIVE mg/dL
Hgb urine dipstick: NEGATIVE
Ketones, ur: NEGATIVE mg/dL
Protein, ur: NEGATIVE mg/dL
Urobilinogen, UA: 1 mg/dL (ref 0.0–1.0)
pH: 5.5 (ref 5.0–8.0)

## 2010-12-24 LAB — PROTIME-INR: Prothrombin Time: 13.4 seconds (ref 11.6–15.2)

## 2010-12-24 LAB — APTT: aPTT: 28 seconds (ref 24–37)

## 2010-12-30 ENCOUNTER — Inpatient Hospital Stay (HOSPITAL_COMMUNITY)
Admission: RE | Admit: 2010-12-30 | Discharge: 2011-01-01 | DRG: 209 | Disposition: A | Discharge status: Home Health | Payer: BC Managed Care – PPO | Source: Ambulatory Visit | Attending: Orthopedic Surgery | Admitting: Orthopedic Surgery

## 2010-12-30 DIAGNOSIS — Z01812 Encounter for preprocedural laboratory examination: Secondary | ICD-10-CM

## 2010-12-30 DIAGNOSIS — M1710 Unilateral primary osteoarthritis, unspecified knee: Principal | ICD-10-CM | POA: Diagnosis present

## 2010-12-30 LAB — GLUCOSE, CAPILLARY: Glucose-Capillary: 142 mg/dL — ABNORMAL HIGH (ref 70–99)

## 2010-12-30 LAB — TYPE AND SCREEN: Antibody Screen: NEGATIVE

## 2010-12-31 LAB — BASIC METABOLIC PANEL
BUN: 9 mg/dL (ref 6–23)
Calcium: 8.3 mg/dL — ABNORMAL LOW (ref 8.4–10.5)
Chloride: 101 mEq/L (ref 96–112)
Creatinine, Ser: 0.69 mg/dL (ref 0.50–1.35)
GFR calc Af Amer: >60 mL/min (ref >60)
Glucose, Bld: 134 mg/dL — ABNORMAL HIGH (ref 70–99)
Potassium: 3.9 mEq/L (ref 3.5–5.1)

## 2010-12-31 LAB — CBC
HCT: 34.2 % — ABNORMAL LOW (ref 39.0–52.0)
Hemoglobin: 12.3 g/dL — ABNORMAL LOW (ref 13.0–17.0)
MCH: 33 pg (ref 26.0–34.0)
MCHC: 36 g/dL (ref 30.0–36.0)
Platelets: 139 10*3/uL — ABNORMAL LOW (ref 150–400)
WBC: 7.4 10*3/uL (ref 4.0–10.5)

## 2011-01-01 LAB — BASIC METABOLIC PANEL
BUN: 9 mg/dL (ref 6–23)
CO2: 31 mEq/L (ref 19–32)
Calcium: 8.5 mg/dL (ref 8.4–10.5)
Creatinine, Ser: 0.72 mg/dL (ref 0.50–1.35)
GFR calc non Af Amer: >60 mL/min (ref >60)
Sodium: 136 mEq/L (ref 135–145)

## 2011-01-01 LAB — CBC
Hemoglobin: 11.3 g/dL — ABNORMAL LOW (ref 13.0–17.0)
MCH: 32.2 pg (ref 26.0–34.0)
Platelets: 142 10*3/uL — ABNORMAL LOW (ref 150–400)
RDW: 12.1 % (ref 11.5–15.5)
WBC: 8.5 10*3/uL (ref 4.0–10.5)

## 2011-01-02 NOTE — Op Note (Signed)
NAMECYPRIAN, GONGAWARE NO.:  [id number]  MEDICAL RECORD NO.:  [id number]  LOCATION:  1610                         FACILITY:  [facility]  PHYSICIAN:  Ollen Gross, M.D.    DATE OF BIRTH:  15-Sep-1955  DATE OF PROCEDURE: DATE OF DISCHARGE:                              OPERATIVE REPORT   PREOPERATIVE DIAGNOSIS:  Osteoarthritis, right knee.  POSTOPERATIVE DIAGNOSIS:  Osteoarthritis, right knee.  PROCEDURE:  Right total knee arthroplasty.  SURGEON:  Ollen Gross, M.D.  ASSISTANT:  Alexzandrew L. Perkins, P.A.C.  ANESTHESIA:  Spinal.  ESTIMATED BLOOD LOSS:  Minimal.  DRAINS:  Hemovac x1.  TOURNIQUET TIME:  45 minutes at 300 mmHg.  COMPLICATIONS:  None.  CONDITION.:  Stable to recovery.  BRIEF CLINICAL NOTE:  He is 55 year old male with an end-stage arthritis of the right knee with progressively worsening pain and dysfunction.  He had a UniSpacer placed about 10 years ago.  He had done well until the past couple of years and he has had progressively worsening pain and dysfunction.  He presents now for right total knee arthroplasty.  PROCEDURE IN DETAIL:  After successful administration of spinal anesthetic, a tourniquet was placed high on the right thigh and right lower extremity, prepped and draped in usual sterile fashion. Extremities wrapped in Esmarch, knee flexed, and tourniquet inflated to 300 mmHg.  Midline incision was made with a 10 blade through subcutaneous tissue to the level of the extensor mechanism.  A fresh blade was used to make a medial parapatellar arthrotomy.  Soft tissue on the proximal medial tibia subperiosteally elevated to joint line with the knife into the semimembranosus bursa with a Cobb elevator.  Soft tissue laterally was elevated with attention being paid to avoiding the patellar tendon on the tibial tubercle.  The patella was everted, knee flexed to 90 degrees and PCL removed.  There was barely any ACL remaining.  The  UniSpacer still intact.  It was very stable and it could dislocate it.  Decided to prepare the femur first and we would be able to get the UniSpacer out.  Drill was used to create a starting hole in the distal femur and a canal was thoroughly irrigated.  The 5-degrees right valgus alignment guide was placed and a distal femoral cutting block was pinned to remove an 11-mm off the distal femur.  Resection was made with an oscillating saw.  Tibia would not sublux forward because of the UniSpacer.  Thus, we completed preparation of the femur.  Sizing guide was placed in size 5 and was most appropriate.  Rotations marked off the epicondylar axis.  Size 5 cutting block was placed and the anterior-posterior chamfer cuts were made.  The tibia was then subluxed forward and I was able to remove the UniSpacer.  The medial meniscus was already out and I removed the lateral meniscus.  The extramedullary tibial cutting guide was placed referencing proximally at the medial aspect of the tibial tubercle and distally along the second metatarsal axis and tibial crest.  Block was pinned to remove 2-mm off the more deficient medial side.  Tibial resection was made with an oscillating saw.  Size 4 was the  most appropriate tibial component and proximal tibia was repaired with modular drill and keel punch for the size 4.  Femoral preparation was completed with the intercondylar cup and size 5.  Size 4 mobile bearing tibial trial, size 5 posterior stabilized femoral trial, and a 12.5-mm posterior stabilized rotating platform insert trial were placed.  We put 12.5 instrument to 15 which allowed for full extension with excellent varus-valgus and anterior-posterior balance throughout and full range of motion.  Patella was everted and thickness measured to be 26 mm.  Freehand resection was taken to 14 mm, 41 template was placed, lug holes were drilled, trial patella was placed, and it tracked normally.   Osteophytes removed off the posterior femur with the trial in place.  All trials were removed and cut bone surfaces prepared with pulsatile lavage.  Cement was mixed and once ready for implantation, the size 4 mobile bearing tibial tray, size 5 posterior stabilized femur, and 41 patella were cemented into place.  The patella was held with a clamp.  Trial 15-mm inserts placed, knee held in full extension and all extruded cement removed.  When cement fully hardened, then the permanent 15-mm posterior stabilized rotating platform insert was placed in the tibial tray.  Wound was copiously irrigated with saline solution and the arthrotomy closed over Hemovac drain with interrupted #1 PDS.  Flexion against gravity was 135 degrees, the patella tracks normally.  Tourniquet released, total time of 45 minutes. Subcu was closed with interrupted 2-0 Vicryl, subcuticular running 4-0 Monocryl.  Catheter for Marcaine pain pump was placed and pumps initiated.  Incision was clean and dried and Steri-Strips and bulky sterile dressing were applied.  He was then placed into a knee immobilizer, awakened and transported to recovery in stable condition.     Ollen Gross, M.D.     FA/MEDQ  D:  12/30/2010  T:  12/30/2010  Job:  [id number]  Electronically Signed by Ollen Gross M.D. on 01/02/2011 04:05:26 PM

## 2012-01-12 ENCOUNTER — Encounter: Payer: Self-pay | Admitting: Family Medicine

## 2012-01-13 ENCOUNTER — Ambulatory Visit (INDEPENDENT_AMBULATORY_CARE_PROVIDER_SITE_OTHER): Payer: BC Managed Care – PPO | Admitting: Family Medicine

## 2012-01-13 ENCOUNTER — Encounter: Payer: Self-pay | Admitting: Family Medicine

## 2012-01-13 VITALS — BP 128/86 | HR 93 | Temp 98.4°F | Wt 183.0 lb

## 2012-01-13 DIAGNOSIS — Z23 Encounter for immunization: Secondary | ICD-10-CM

## 2012-01-13 DIAGNOSIS — Z0000 Encounter for general adult medical examination without abnormal findings: Secondary | ICD-10-CM

## 2012-01-13 NOTE — Progress Notes (Signed)
  Subjective:    Patient ID: [name], male    DOB: May 21, 1955, 56 y.o.   MRN: [id number]  HPI 56 yr old male for a cpx. He was a previous patient of Dr. Scotty Court who is transfering to me. 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 He has some generalized arthritis, but he manages this well with only some occasional Ibuprofen. Otherwise he is doing well.    Review of Systems  Constitutional: Negative.   HENT: Negative.   Eyes: Negative.   Respiratory: Negative.   Cardiovascular: Negative.   Gastrointestinal: Negative.   Genitourinary: Negative.   Musculoskeletal: Positive for arthralgias. Negative for myalgias, back pain, joint swelling and gait problem.  Skin: Negative.   Neurological: Negative.   Hematological: Negative.   Psychiatric/Behavioral: Negative.        Objective:   Physical Exam  Constitutional: He is oriented to person, place, and time. He appears well-developed and well-nourished. No distress.  HENT:  Head: Normocephalic and atraumatic.  Right Ear: External ear normal.  Left Ear: External ear normal.  Nose: Nose normal.  Mouth/Throat: Oropharynx is clear and moist. No oropharyngeal exudate.  Eyes: Conjunctivae and EOM are normal. Pupils are equal, round, and reactive to light. Right eye exhibits no discharge. Left eye exhibits no discharge. No scleral icterus.  Neck: Neck supple. No JVD present. No tracheal deviation present. No thyromegaly present.  Cardiovascular: Normal rate, regular rhythm, normal heart sounds and intact distal pulses.  Exam reveals no gallop and no friction rub.   No murmur heard.      EKG normal   Pulmonary/Chest: Effort normal and breath sounds normal. No respiratory distress. He has no wheezes. He has no rales. He exhibits no tenderness.  Abdominal: Soft. Bowel sounds are normal. He exhibits no distension and no mass. There is no tenderness. There is no rebound and no guarding.  Genitourinary: Rectum normal, prostate normal  and penis normal. Guaiac negative stool. No penile tenderness.  Musculoskeletal: Normal range of motion. He exhibits no edema and no tenderness.  Lymphadenopathy:    He has no cervical adenopathy.  Neurological: He is alert and oriented to person, place, and time. He has normal reflexes. No cranial nerve deficit. He exhibits normal muscle tone. Coordination normal.  Skin: Skin is warm and dry. No rash noted. He is not diaphoretic. No erythema. No pallor.  Psychiatric: He has a normal mood and affect. His behavior is normal. Judgment and thought content normal.          Assessment & Plan:  Well exam. Set up fasting labs soon.

## 2012-01-15 ENCOUNTER — Other Ambulatory Visit (INDEPENDENT_AMBULATORY_CARE_PROVIDER_SITE_OTHER): Payer: BC Managed Care – PPO

## 2012-01-15 DIAGNOSIS — Z0000 Encounter for general adult medical examination without abnormal findings: Secondary | ICD-10-CM

## 2012-01-15 LAB — BASIC METABOLIC PANEL
Glucose, Bld: 113 mg/dL — ABNORMAL HIGH (ref 70–99)
Potassium: 4.8 mEq/L (ref 3.5–5.1)

## 2012-01-15 LAB — CBC WITH DIFFERENTIAL/PLATELET
Eosinophils Relative: 2.9 % (ref 0.0–5.0)
Lymphocytes Relative: 40.4 % (ref 12.0–46.0)
Monocytes Absolute: 0.7 10*3/uL (ref 0.1–1.0)
Monocytes Relative: 14.9 % — ABNORMAL HIGH (ref 3.0–12.0)
Neutrophils Relative %: 41.2 % — ABNORMAL LOW (ref 43.0–77.0)
Platelets: 172 10*3/uL (ref 150.0–400.0)
RBC: 4.73 Mil/uL (ref 4.22–5.81)
WBC: 4.9 10*3/uL (ref 4.5–10.5)

## 2012-01-15 LAB — TSH: TSH: 2.08 u[IU]/mL (ref 0.35–5.50)

## 2012-01-15 LAB — HEPATIC FUNCTION PANEL
ALT: 23 U/L (ref 0–53)
AST: 21 U/L (ref 0–37)
Albumin: 4 g/dL (ref 3.5–5.2)
Total Protein: 7 g/dL (ref 6.0–8.3)

## 2013-02-04 ENCOUNTER — Ambulatory Visit (INDEPENDENT_AMBULATORY_CARE_PROVIDER_SITE_OTHER): Payer: BC Managed Care – PPO

## 2013-02-04 DIAGNOSIS — Z23 Encounter for immunization: Secondary | ICD-10-CM

## 2013-12-27 ENCOUNTER — Other Ambulatory Visit (INDEPENDENT_AMBULATORY_CARE_PROVIDER_SITE_OTHER): Payer: BC Managed Care – PPO

## 2013-12-27 DIAGNOSIS — Z0000 Encounter for general adult medical examination without abnormal findings: Secondary | ICD-10-CM

## 2013-12-27 LAB — HEPATIC FUNCTION PANEL
ALBUMIN: 3.9 g/dL (ref 3.5–5.2)
ALK PHOS: 55 U/L (ref 39–117)
ALT: 24 U/L (ref 0–53)
AST: 21 U/L (ref 0–37)
BILIRUBIN DIRECT: 0.2 mg/dL (ref 0.0–0.3)
BILIRUBIN TOTAL: 1.2 mg/dL (ref 0.2–1.2)
Total Protein: 6.8 g/dL (ref 6.0–8.3)

## 2013-12-27 LAB — BASIC METABOLIC PANEL
BUN: 15 mg/dL (ref 6–23)
CALCIUM: 9 mg/dL (ref 8.4–10.5)
CO2: 29 mEq/L (ref 19–32)
CREATININE: 1 mg/dL (ref 0.4–1.5)
Chloride: 101 mEq/L (ref 96–112)
GFR: 84.97 mL/min (ref >60.00)
Glucose, Bld: 97 mg/dL (ref 70–99)
Potassium: 4.3 mEq/L (ref 3.5–5.1)
Sodium: 138 mEq/L (ref 135–145)

## 2013-12-27 LAB — CBC WITH DIFFERENTIAL/PLATELET
Basophils Absolute: 0 K/uL (ref 0.0–0.1)
Basophils Relative: 0.8 % (ref 0.0–3.0)
Eosinophils Absolute: 0.1 K/uL (ref 0.0–0.7)
Eosinophils Relative: 2.4 % (ref 0.0–5.0)
HCT: 44.3 % (ref 39.0–52.0)
Hemoglobin: 15 g/dL (ref 13.0–17.0)
Lymphocytes Relative: 38.7 % (ref 12.0–46.0)
Lymphs Abs: 2.2 K/uL (ref 0.7–4.0)
MCHC: 33.9 g/dL (ref 30.0–36.0)
MCV: 96.1 fl (ref 78.0–100.0)
Monocytes Absolute: 0.7 K/uL (ref 0.1–1.0)
Monocytes Relative: 12.5 % — ABNORMAL HIGH (ref 3.0–12.0)
Neutro Abs: 2.6 K/uL (ref 1.4–7.7)
Neutrophils Relative %: 45.6 % (ref 43.0–77.0)
Platelets: 174 K/uL (ref 150.0–400.0)
RBC: 4.61 Mil/uL (ref 4.22–5.81)
RDW: 12.8 % (ref 11.5–15.5)
WBC: 5.8 K/uL (ref 4.0–10.5)

## 2013-12-27 LAB — LIPID PANEL
CHOL/HDL RATIO: 4
CHOLESTEROL: 174 mg/dL (ref 0–200)
HDL: 49.6 mg/dL (ref >39.00)
LDL Cholesterol: 105 mg/dL — ABNORMAL HIGH (ref 0–99)
NonHDL: 124.4
TRIGLYCERIDES: 99 mg/dL (ref 0.0–149.0)
VLDL: 19.8 mg/dL (ref 0.0–40.0)

## 2013-12-27 LAB — POCT URINALYSIS DIPSTICK
BILIRUBIN UA: NEGATIVE
GLUCOSE UA: NEGATIVE
KETONES UA: NEGATIVE
Leukocytes, UA: NEGATIVE
Nitrite, UA: NEGATIVE
Protein, UA: NEGATIVE
RBC UA: NEGATIVE
SPEC GRAV UA: 1.01
UROBILINOGEN UA: 0.2
pH, UA: 7

## 2013-12-27 LAB — TSH: TSH: 2.65 u[IU]/mL (ref 0.35–4.50)

## 2013-12-27 LAB — PSA: PSA: 1.28 ng/mL (ref 0.10–4.00)

## 2014-01-03 ENCOUNTER — Encounter: Payer: Self-pay | Admitting: Family Medicine

## 2014-01-03 ENCOUNTER — Ambulatory Visit (INDEPENDENT_AMBULATORY_CARE_PROVIDER_SITE_OTHER): Payer: BC Managed Care – PPO | Admitting: Family Medicine

## 2014-01-03 VITALS — BP 131/82 | HR 81 | Temp 98.4°F | Ht 68.5 in | Wt 181.0 lb

## 2014-01-03 DIAGNOSIS — Z0000 Encounter for general adult medical examination without abnormal findings: Secondary | ICD-10-CM

## 2014-01-03 DIAGNOSIS — Z23 Encounter for immunization: Secondary | ICD-10-CM

## 2014-01-03 MED ORDER — DICLOFENAC SODIUM 75 MG PO TBEC: 75.0000 mg | DELAYED_RELEASE_TABLET | Freq: Two times a day (BID) | ORAL | Status: DC

## 2014-01-03 NOTE — Progress Notes (Signed)
   Subjective:    Patient ID: [name], male    DOB: 1955/12/10, 58 y.o.   MRN: [id number]  HPI 58 yr old male for a cpx. He feels well except for some joint pains. He is active and works out at [employer] 3 days a week.    Review of Systems  Constitutional: Negative.   HENT: Negative.   Eyes: Negative.   Respiratory: Negative.   Cardiovascular: Negative.   Gastrointestinal: Negative.   Genitourinary: Negative.   Musculoskeletal: Negative.   Skin: Negative.   Neurological: Negative.   Psychiatric/Behavioral: Negative.        Objective:   Physical Exam  Constitutional: He is oriented to person, place, and time. He appears well-developed and well-nourished. No distress.  HENT:  Head: Normocephalic and atraumatic.  Right Ear: External ear normal.  Left Ear: External ear normal.  Nose: Nose normal.  Mouth/Throat: Oropharynx is clear and moist. No oropharyngeal exudate.  Eyes: Conjunctivae and EOM are normal. Pupils are equal, round, and reactive to light. Right eye exhibits no discharge. Left eye exhibits no discharge. No scleral icterus.  Neck: Neck supple. No JVD present. No tracheal deviation present. No thyromegaly present.  Cardiovascular: Normal rate, regular rhythm, normal heart sounds and intact distal pulses.  Exam reveals no gallop and no friction rub.   No murmur heard. EKG normal   Pulmonary/Chest: Effort normal and breath sounds normal. No respiratory distress. He has no wheezes. He has no rales. He exhibits no tenderness.  Abdominal: Soft. Bowel sounds are normal. He exhibits no distension and no mass. There is no tenderness. There is no rebound and no guarding.  Genitourinary: Rectum normal, prostate normal and penis normal. Guaiac negative stool. No penile tenderness.  Musculoskeletal: Normal range of motion. He exhibits no edema and no tenderness.  Lymphadenopathy:    He has no cervical adenopathy.  Neurological: He is alert and oriented to person, place, and  time. He has normal reflexes. No cranial nerve deficit. He exhibits normal muscle tone. Coordination normal.  Skin: Skin is warm and dry. No rash noted. He is not diaphoretic. No erythema. No pallor.  Psychiatric: He has a normal mood and affect. His behavior is normal. Judgment and thought content normal.          Assessment & Plan:  Well exam.

## 2014-01-03 NOTE — Progress Notes (Signed)
Pre visit review using our clinic review tool, if applicable. No additional management support is needed unless otherwise documented below in the visit note. 

## 2014-01-03 NOTE — Addendum Note (Signed)
Addended by: Aniceto Boss A on: 01/03/2014 02:49 PM   Modules accepted: Orders

## 2014-12-28 ENCOUNTER — Other Ambulatory Visit: Payer: Self-pay | Admitting: Family Medicine

## 2015-01-15 ENCOUNTER — Encounter: Payer: Self-pay | Admitting: Family Medicine

## 2015-01-15 ENCOUNTER — Ambulatory Visit (INDEPENDENT_AMBULATORY_CARE_PROVIDER_SITE_OTHER): Payer: BC Managed Care – PPO | Admitting: Family Medicine

## 2015-01-15 VITALS — BP 133/87 | HR 76 | Temp 98.1°F | Ht 68.5 in | Wt 183.0 lb

## 2015-01-15 DIAGNOSIS — Z23 Encounter for immunization: Secondary | ICD-10-CM

## 2015-01-15 DIAGNOSIS — Z0000 Encounter for general adult medical examination without abnormal findings: Secondary | ICD-10-CM

## 2015-01-15 DIAGNOSIS — K921 Melena: Secondary | ICD-10-CM

## 2015-01-15 LAB — HEPATIC FUNCTION PANEL
ALK PHOS: 61 U/L (ref 39–117)
ALT: 26 U/L (ref 0–53)
AST: 16 U/L (ref 0–37)
Albumin: 4.2 g/dL (ref 3.5–5.2)
Bilirubin, Direct: 0.2 mg/dL (ref 0.0–0.3)
TOTAL PROTEIN: 6.8 g/dL (ref 6.0–8.3)
Total Bilirubin: 0.7 mg/dL (ref 0.2–1.2)

## 2015-01-15 LAB — POCT URINALYSIS DIPSTICK
BILIRUBIN UA: NEGATIVE
Glucose, UA: NEGATIVE
Ketones, UA: NEGATIVE
Leukocytes, UA: NEGATIVE
Nitrite, UA: NEGATIVE
PH UA: 5.5
Protein, UA: NEGATIVE
RBC UA: NEGATIVE
Spec Grav, UA: 1.025
Urobilinogen, UA: 0.2

## 2015-01-15 LAB — LIPID PANEL
CHOLESTEROL: 171 mg/dL (ref 0–200)
HDL: 54.4 mg/dL (ref >39.00)
LDL Cholesterol: 105 mg/dL — ABNORMAL HIGH (ref 0–99)
NonHDL: 116.76
TRIGLYCERIDES: 59 mg/dL (ref 0.0–149.0)
Total CHOL/HDL Ratio: 3
VLDL: 11.8 mg/dL (ref 0.0–40.0)

## 2015-01-15 LAB — CBC WITH DIFFERENTIAL/PLATELET
BASOS PCT: 0.6 % (ref 0.0–3.0)
Basophils Absolute: 0 10*3/uL (ref 0.0–0.1)
EOS ABS: 0 10*3/uL (ref 0.0–0.7)
EOS PCT: 0.6 % (ref 0.0–5.0)
HCT: 45.3 % (ref 39.0–52.0)
Hemoglobin: 15.3 g/dL (ref 13.0–17.0)
Lymphocytes Relative: 30.9 % (ref 12.0–46.0)
Lymphs Abs: 1.8 10*3/uL (ref 0.7–4.0)
MCHC: 33.9 g/dL (ref 30.0–36.0)
MCV: 96 fl (ref 78.0–100.0)
MONO ABS: 0.6 10*3/uL (ref 0.1–1.0)
Monocytes Relative: 10.5 % (ref 3.0–12.0)
Neutro Abs: 3.3 10*3/uL (ref 1.4–7.7)
Neutrophils Relative %: 57.4 % (ref 43.0–77.0)
PLATELETS: 173 10*3/uL (ref 150.0–400.0)
RBC: 4.71 Mil/uL (ref 4.22–5.81)
RDW: 13.1 % (ref 11.5–15.5)
WBC: 5.7 10*3/uL (ref 4.0–10.5)

## 2015-01-15 LAB — BASIC METABOLIC PANEL
BUN: 18 mg/dL (ref 6–23)
CHLORIDE: 103 meq/L (ref 96–112)
CO2: 29 meq/L (ref 19–32)
Calcium: 9.3 mg/dL (ref 8.4–10.5)
Creatinine, Ser: 0.87 mg/dL (ref 0.40–1.50)
GFR: 94.86 mL/min (ref >60.00)
GLUCOSE: 105 mg/dL — AB (ref 70–99)
POTASSIUM: 5.1 meq/L (ref 3.5–5.1)
Sodium: 140 mEq/L (ref 135–145)

## 2015-01-15 LAB — TSH: TSH: 1.82 u[IU]/mL (ref 0.35–4.50)

## 2015-01-15 LAB — PSA: PSA: 1.66 ng/mL (ref 0.10–4.00)

## 2015-01-15 NOTE — Progress Notes (Signed)
Pre visit review using our clinic review tool, if applicable. No additional management support is needed unless otherwise documented below in the visit note. 

## 2015-01-15 NOTE — Progress Notes (Signed)
   Subjective:    Patient ID: [name], male    DOB: Apr 10, 1956, 59 y.o.   MRN: [id number]  HPI 59 yr old male for a cpx. He feels well in general except for joint pains. He takes Diclofenac prn for these. One problem he mentions os intermittent bright red blood per rectum over the past 6 months. His stools are never large or hard and they are easy to pass. No rectal pain. He will be due for a colonoscopy next year.    Review of Systems  Constitutional: Negative.   HENT: Negative.   Eyes: Negative.   Respiratory: Negative.   Cardiovascular: Negative.   Gastrointestinal: Positive for blood in stool and anal bleeding. Negative for nausea, vomiting, abdominal pain, diarrhea, constipation, abdominal distention and rectal pain.  Genitourinary: Negative.   Musculoskeletal: Positive for arthralgias. Negative for myalgias, back pain, joint swelling, gait problem, neck pain and neck stiffness.  Skin: Negative.   Neurological: Negative.   Psychiatric/Behavioral: Negative.        Objective:   Physical Exam  Constitutional: He is oriented to person, place, and time. He appears well-developed and well-nourished. No distress.  HENT:  Head: Normocephalic and atraumatic.  Right Ear: External ear normal.  Left Ear: External ear normal.  Nose: Nose normal.  Mouth/Throat: Oropharynx is clear and moist. No oropharyngeal exudate.  Eyes: Conjunctivae and EOM are normal. Pupils are equal, round, and reactive to light. Right eye exhibits no discharge. Left eye exhibits no discharge. No scleral icterus.  Neck: Neck supple. No JVD present. No tracheal deviation present. No thyromegaly present.  Cardiovascular: Normal rate, regular rhythm, normal heart sounds and intact distal pulses.  Exam reveals no gallop and no friction rub.   No murmur heard. Pulmonary/Chest: Effort normal and breath sounds normal. No respiratory distress. He has no wheezes. He has no rales. He exhibits no tenderness.  Abdominal:  Soft. Bowel sounds are normal. He exhibits no distension and no mass. There is no tenderness. There is no rebound and no guarding.  Genitourinary: Rectum normal, prostate normal and penis normal. Guaiac negative stool. No penile tenderness.  Musculoskeletal: Normal range of motion. He exhibits no edema or tenderness.  Lymphadenopathy:    He has no cervical adenopathy.  Neurological: He is alert and oriented to person, place, and time. He has normal reflexes. No cranial nerve deficit. He exhibits normal muscle tone. Coordination normal.  Skin: Skin is warm and dry. No rash noted. He is not diaphoretic. No erythema. No pallor.  Psychiatric: He has a normal mood and affect. His behavior is normal. Judgment and thought content normal.          Assessment & Plan:  Well exam. Get fasting labs. We will refer to GI for the rectal bleeding.

## 2015-01-15 NOTE — Addendum Note (Signed)
Addended by: Aniceto Boss A on: 01/15/2015 10:47 AM   Modules accepted: Orders

## 2015-07-30 ENCOUNTER — Other Ambulatory Visit: Payer: Self-pay | Admitting: Family Medicine

## 2015-08-04 HISTORY — PX: COLONOSCOPY: SHX174

## 2016-02-13 ENCOUNTER — Other Ambulatory Visit: Payer: Self-pay | Admitting: Family Medicine

## 2016-02-27 ENCOUNTER — Encounter: Payer: Self-pay | Admitting: Family Medicine

## 2016-02-27 ENCOUNTER — Ambulatory Visit (INDEPENDENT_AMBULATORY_CARE_PROVIDER_SITE_OTHER): Payer: BC Managed Care – PPO | Admitting: Family Medicine

## 2016-02-27 VITALS — BP 131/88 | HR 71 | Temp 97.9°F | Ht 68.5 in | Wt 185.0 lb

## 2016-02-27 DIAGNOSIS — Z23 Encounter for immunization: Secondary | ICD-10-CM

## 2016-02-27 DIAGNOSIS — Z0000 Encounter for general adult medical examination without abnormal findings: Secondary | ICD-10-CM

## 2016-02-27 LAB — LIPID PANEL
CHOL/HDL RATIO: 4
CHOLESTEROL: 182 mg/dL (ref 0–200)
HDL: 51.3 mg/dL (ref >39.00)
LDL CALC: 112 mg/dL — AB (ref 0–99)
NonHDL: 131
TRIGLYCERIDES: 97 mg/dL (ref 0.0–149.0)
VLDL: 19.4 mg/dL (ref 0.0–40.0)

## 2016-02-27 LAB — POC URINALSYSI DIPSTICK (AUTOMATED)
BILIRUBIN UA: NEGATIVE
Blood, UA: NEGATIVE
Glucose, UA: NEGATIVE
Ketones, UA: NEGATIVE
LEUKOCYTES UA: NEGATIVE
NITRITE UA: NEGATIVE
PH UA: 6.5
Protein, UA: NEGATIVE
Spec Grav, UA: 1.01
Urobilinogen, UA: 0.2

## 2016-02-27 LAB — CBC WITH DIFFERENTIAL/PLATELET
BASOS ABS: 0 10*3/uL (ref 0.0–0.1)
Basophils Relative: 0.5 % (ref 0.0–3.0)
Eosinophils Absolute: 0.1 10*3/uL (ref 0.0–0.7)
Eosinophils Relative: 1.8 % (ref 0.0–5.0)
HEMATOCRIT: 45.4 % (ref 39.0–52.0)
Hemoglobin: 15.7 g/dL (ref 13.0–17.0)
LYMPHS PCT: 40.8 % (ref 12.0–46.0)
Lymphs Abs: 2.7 10*3/uL (ref 0.7–4.0)
MCHC: 34.5 g/dL (ref 30.0–36.0)
MCV: 94.1 fl (ref 78.0–100.0)
MONOS PCT: 12.7 % — AB (ref 3.0–12.0)
Monocytes Absolute: 0.8 10*3/uL (ref 0.1–1.0)
Neutro Abs: 2.9 10*3/uL (ref 1.4–7.7)
Neutrophils Relative %: 44.2 % (ref 43.0–77.0)
PLATELETS: 188 10*3/uL (ref 150.0–400.0)
RBC: 4.83 Mil/uL (ref 4.22–5.81)
RDW: 12.6 % (ref 11.5–15.5)
WBC: 6.5 10*3/uL (ref 4.0–10.5)

## 2016-02-27 LAB — TSH: TSH: 2.05 u[IU]/mL (ref 0.35–4.50)

## 2016-02-27 LAB — BASIC METABOLIC PANEL
BUN: 16 mg/dL (ref 6–23)
CHLORIDE: 102 meq/L (ref 96–112)
CO2: 29 mEq/L (ref 19–32)
Calcium: 9.4 mg/dL (ref 8.4–10.5)
Creatinine, Ser: 0.93 mg/dL (ref 0.40–1.50)
GFR: 87.51 mL/min (ref >60.00)
GLUCOSE: 91 mg/dL (ref 70–99)
POTASSIUM: 4.5 meq/L (ref 3.5–5.1)
SODIUM: 139 meq/L (ref 135–145)

## 2016-02-27 LAB — HEPATIC FUNCTION PANEL
ALK PHOS: 58 U/L (ref 39–117)
ALT: 26 U/L (ref 0–53)
AST: 18 U/L (ref 0–37)
Albumin: 4.4 g/dL (ref 3.5–5.2)
BILIRUBIN DIRECT: 0.2 mg/dL (ref 0.0–0.3)
Total Bilirubin: 1 mg/dL (ref 0.2–1.2)
Total Protein: 7 g/dL (ref 6.0–8.3)

## 2016-02-27 LAB — PSA: PSA: 3.12 ng/mL (ref 0.10–4.00)

## 2016-02-27 MED ORDER — DICLOFENAC SODIUM 75 MG PO TBEC: DELAYED_RELEASE_TABLET | ORAL | 3 refills | Status: DC

## 2016-02-27 NOTE — Progress Notes (Signed)
   Subjective:    Patient ID: Fernando ComoRandy K Durrell, male    DOB: 03/22/1956, 60 y.o.   MRN: [id number]  HPI 60  yr old male for a well exam. He feels fine. His arthritis is well controlled.    Review of Systems  Constitutional: Negative.   HENT: Negative.   Eyes: Negative.   Respiratory: Negative.   Cardiovascular: Negative.   Gastrointestinal: Negative.   Genitourinary: Negative.   Musculoskeletal: Negative.   Skin: Negative.   Neurological: Negative.   Psychiatric/Behavioral: Negative.        Objective:   Physical Exam  Constitutional: He is oriented to person, place, and time. He appears well-developed and well-nourished. No distress.  HENT:  Head: Normocephalic and atraumatic.  Right Ear: External ear normal.  Left Ear: External ear normal.  Nose: Nose normal.  Mouth/Throat: Oropharynx is clear and moist. No oropharyngeal exudate.  Eyes: Conjunctivae and EOM are normal. Pupils are equal, round, and reactive to light. Right eye exhibits no discharge. Left eye exhibits no discharge. No scleral icterus.  Neck: Neck supple. No JVD present. No tracheal deviation present. No thyromegaly present.  Cardiovascular: Normal rate, regular rhythm, normal heart sounds and intact distal pulses.  Exam reveals no gallop and no friction rub.   No murmur heard. EKG is normal   Pulmonary/Chest: Effort normal and breath sounds normal. No respiratory distress. He has no wheezes. He has no rales. He exhibits no tenderness.  Abdominal: Soft. Bowel sounds are normal. He exhibits no distension and no mass. There is no tenderness. There is no rebound and no guarding.  Genitourinary: Rectum normal, prostate normal and penis normal. Rectal exam shows guaiac negative stool. No penile tenderness.  Musculoskeletal: Normal range of motion. He exhibits no edema or tenderness.  Lymphadenopathy:    He has no cervical adenopathy.  Neurological: He is alert and oriented to person, place, and time. He has normal  reflexes. No cranial nerve deficit. He exhibits normal muscle tone. Coordination normal.  Skin: Skin is warm and dry. No rash noted. He is not diaphoretic. No erythema. No pallor.  Psychiatric: He has a normal mood and affect. His behavior is normal. Judgment and thought content normal.          Assessment & Plan:  Well exam. We discussed diet and exercise.  Nelwyn SalisburyFRY,Raj Landress A, MD

## 2016-02-27 NOTE — Progress Notes (Signed)
Pre visit review using our clinic review tool, if applicable. No additional management support is needed unless otherwise documented below in the visit note. 

## 2017-01-22 ENCOUNTER — Encounter: Payer: Self-pay | Admitting: Family Medicine

## 2017-02-27 ENCOUNTER — Ambulatory Visit (INDEPENDENT_AMBULATORY_CARE_PROVIDER_SITE_OTHER): Payer: BC Managed Care – PPO | Admitting: Family Medicine

## 2017-02-27 ENCOUNTER — Encounter: Payer: Self-pay | Admitting: Family Medicine

## 2017-02-27 VITALS — BP 136/88 | HR 83 | Temp 98.2°F | Ht 68.5 in | Wt 185.0 lb

## 2017-02-27 DIAGNOSIS — Z23 Encounter for immunization: Secondary | ICD-10-CM

## 2017-02-27 DIAGNOSIS — Z0000 Encounter for general adult medical examination without abnormal findings: Secondary | ICD-10-CM

## 2017-02-27 DIAGNOSIS — Z125 Encounter for screening for malignant neoplasm of prostate: Secondary | ICD-10-CM

## 2017-02-27 LAB — CBC WITH DIFFERENTIAL/PLATELET
BASOS PCT: 0.6 % (ref 0.0–3.0)
Basophils Absolute: 0 10*3/uL (ref 0.0–0.1)
EOS ABS: 0.1 10*3/uL (ref 0.0–0.7)
EOS PCT: 0.8 % (ref 0.0–5.0)
HEMATOCRIT: 44.2 % (ref 39.0–52.0)
HEMOGLOBIN: 15.1 g/dL (ref 13.0–17.0)
LYMPHS PCT: 25.8 % (ref 12.0–46.0)
Lymphs Abs: 1.9 10*3/uL (ref 0.7–4.0)
MCHC: 34.2 g/dL (ref 30.0–36.0)
MCV: 95.9 fl (ref 78.0–100.0)
MONO ABS: 0.7 10*3/uL (ref 0.1–1.0)
Monocytes Relative: 10 % (ref 3.0–12.0)
NEUTROS ABS: 4.6 10*3/uL (ref 1.4–7.7)
Neutrophils Relative %: 62.8 % (ref 43.0–77.0)
PLATELETS: 181 10*3/uL (ref 150.0–400.0)
RBC: 4.61 Mil/uL (ref 4.22–5.81)
RDW: 12.7 % (ref 11.5–15.5)
WBC: 7.4 10*3/uL (ref 4.0–10.5)

## 2017-02-27 LAB — POC URINALSYSI DIPSTICK (AUTOMATED)
Bilirubin, UA: NEGATIVE
Glucose, UA: NEGATIVE
Leukocytes, UA: NEGATIVE
NITRITE UA: NEGATIVE
PH UA: 6 (ref 5.0–8.0)
Protein, UA: NEGATIVE
RBC UA: NEGATIVE
SPEC GRAV UA: 1.02 (ref 1.010–1.025)
UROBILINOGEN UA: 1 U/dL

## 2017-02-27 LAB — LIPID PANEL
Cholesterol: 160 mg/dL (ref 0–200)
HDL: 52.2 mg/dL (ref >39.00)
LDL CALC: 96 mg/dL (ref 0–99)
NonHDL: 108.19
TRIGLYCERIDES: 59 mg/dL (ref 0.0–149.0)
Total CHOL/HDL Ratio: 3
VLDL: 11.8 mg/dL (ref 0.0–40.0)

## 2017-02-27 LAB — HEPATIC FUNCTION PANEL
ALBUMIN: 4.3 g/dL (ref 3.5–5.2)
ALT: 24 U/L (ref 0–53)
AST: 19 U/L (ref 0–37)
Alkaline Phosphatase: 60 U/L (ref 39–117)
Bilirubin, Direct: 0.2 mg/dL (ref 0.0–0.3)
Total Bilirubin: 1.1 mg/dL (ref 0.2–1.2)
Total Protein: 6.5 g/dL (ref 6.0–8.3)

## 2017-02-27 LAB — BASIC METABOLIC PANEL
BUN: 18 mg/dL (ref 6–23)
CALCIUM: 9.1 mg/dL (ref 8.4–10.5)
CO2: 30 mEq/L (ref 19–32)
CREATININE: 0.81 mg/dL (ref 0.40–1.50)
Chloride: 101 mEq/L (ref 96–112)
GFR: 102.3 mL/min (ref >60.00)
Glucose, Bld: 99 mg/dL (ref 70–99)
POTASSIUM: 3.9 meq/L (ref 3.5–5.1)
Sodium: 138 mEq/L (ref 135–145)

## 2017-02-27 LAB — PSA: PSA: 3.28 ng/mL (ref 0.10–4.00)

## 2017-02-27 LAB — TSH: TSH: 1.47 u[IU]/mL (ref 0.35–4.50)

## 2017-02-27 MED ORDER — DICLOFENAC SODIUM 75 MG PO TBEC: DELAYED_RELEASE_TABLET | ORAL | 3 refills | Status: DC

## 2017-02-27 NOTE — Progress Notes (Signed)
   Subjective:    Patient ID: [name], male    DOB: 04/22/1956, 61 y.o.   MRN: [id number]  HPI Here for a well exam. He feels good. His arthritis is controlled for the most part with Diclofenac.    Review of Systems  Constitutional: Negative.   HENT: Negative.   Eyes: Negative.   Respiratory: Negative.   Cardiovascular: Negative.   Gastrointestinal: Negative.   Genitourinary: Negative.   Musculoskeletal: Positive for arthralgias. Negative for back pain, gait problem, joint swelling, myalgias, neck pain and neck stiffness.  Skin: Negative.   Neurological: Negative.   Psychiatric/Behavioral: Negative.        Objective:   Physical Exam  Constitutional: He is oriented to person, place, and time. He appears well-developed and well-nourished. No distress.  HENT:  Head: Normocephalic and atraumatic.  Right Ear: External ear normal.  Left Ear: External ear normal.  Nose: Nose normal.  Mouth/Throat: Oropharynx is clear and moist. No oropharyngeal exudate.  Eyes: Pupils are equal, round, and reactive to light. Conjunctivae and EOM are normal. Right eye exhibits no discharge. Left eye exhibits no discharge. No scleral icterus.  Neck: Neck supple. No JVD present. No tracheal deviation present. No thyromegaly present.  Cardiovascular: Normal rate, regular rhythm, normal heart sounds and intact distal pulses.  Exam reveals no gallop and no friction rub.   No murmur heard. Pulmonary/Chest: Effort normal and breath sounds normal. No respiratory distress. He has no wheezes. He has no rales. He exhibits no tenderness.  Abdominal: Soft. Bowel sounds are normal. He exhibits no distension and no mass. There is no tenderness. There is no rebound and no guarding.  Genitourinary: Rectum normal, prostate normal and penis normal. Rectal exam shows guaiac negative stool. No penile tenderness.  Musculoskeletal: Normal range of motion. He exhibits no edema or tenderness.  Lymphadenopathy:    He has  no cervical adenopathy.  Neurological: He is alert and oriented to person, place, and time. He has normal reflexes. No cranial nerve deficit. He exhibits normal muscle tone. Coordination normal.  Skin: Skin is warm and dry. No rash noted. He is not diaphoretic. No erythema. No pallor.  Psychiatric: He has a normal mood and affect. His behavior is normal. Judgment and thought content normal.          Assessment & Plan:  Well exam. We discussed diet and exercise. Get fasting labs.  Gershon CraneStephen Tayveon Lombardo, MD

## 2017-02-27 NOTE — Patient Instructions (Signed)
WE NOW OFFER   [facility] FAST TRACK!!!  SAME DAY Appointments for ACUTE CARE  Such as: Sprains, Injuries, cuts, abrasions, rashes, muscle pain, joint pain, back pain Colds, flu, sore throats, headache, allergies, cough, fever  Ear pain, sinus and eye infections Abdominal pain, nausea, vomiting, diarrhea, upset stomach Animal/insect bites  3 Easy Ways to Schedule: Walk-In Scheduling Call in scheduling Mychart Sign-up: [email or web address]         

## 2017-04-30 ENCOUNTER — Ambulatory Visit (INDEPENDENT_AMBULATORY_CARE_PROVIDER_SITE_OTHER): Payer: BC Managed Care – PPO

## 2017-04-30 DIAGNOSIS — Z23 Encounter for immunization: Secondary | ICD-10-CM | POA: Diagnosis not present

## 2018-03-02 ENCOUNTER — Encounter: Payer: BC Managed Care – PPO | Admitting: Family Medicine

## 2018-03-08 ENCOUNTER — Encounter: Payer: Self-pay | Admitting: Family Medicine

## 2018-03-08 ENCOUNTER — Ambulatory Visit (INDEPENDENT_AMBULATORY_CARE_PROVIDER_SITE_OTHER): Payer: BC Managed Care – PPO | Admitting: Family Medicine

## 2018-03-08 VITALS — BP 136/90 | HR 94 | Temp 98.0°F | Ht 69.0 in | Wt 188.0 lb

## 2018-03-08 DIAGNOSIS — Z0000 Encounter for general adult medical examination without abnormal findings: Secondary | ICD-10-CM

## 2018-03-08 DIAGNOSIS — Z125 Encounter for screening for malignant neoplasm of prostate: Secondary | ICD-10-CM

## 2018-03-08 DIAGNOSIS — Z23 Encounter for immunization: Secondary | ICD-10-CM | POA: Diagnosis not present

## 2018-03-08 LAB — POC URINALSYSI DIPSTICK (AUTOMATED)
BILIRUBIN UA: NEGATIVE
Blood, UA: NEGATIVE
Glucose, UA: NEGATIVE
KETONES UA: NEGATIVE
Leukocytes, UA: NEGATIVE
Nitrite, UA: NEGATIVE
PH UA: 6.5 (ref 5.0–8.0)
PROTEIN UA: NEGATIVE
Spec Grav, UA: 1.015 (ref 1.010–1.025)
Urobilinogen, UA: 0.2 E.U./dL

## 2018-03-08 LAB — CBC WITH DIFFERENTIAL/PLATELET
Basophils Absolute: 0 10*3/uL (ref 0.0–0.1)
Basophils Relative: 0.8 % (ref 0.0–3.0)
EOS PCT: 0.8 % (ref 0.0–5.0)
Eosinophils Absolute: 0 10*3/uL (ref 0.0–0.7)
HCT: 45 % (ref 39.0–52.0)
Hemoglobin: 15.6 g/dL (ref 13.0–17.0)
Lymphocytes Relative: 37.7 % (ref 12.0–46.0)
Lymphs Abs: 2 10*3/uL (ref 0.7–4.0)
MCHC: 34.7 g/dL (ref 30.0–36.0)
MCV: 95.2 fl (ref 78.0–100.0)
MONO ABS: 0.7 10*3/uL (ref 0.1–1.0)
Monocytes Relative: 12.6 % — ABNORMAL HIGH (ref 3.0–12.0)
NEUTROS ABS: 2.5 10*3/uL (ref 1.4–7.7)
NEUTROS PCT: 48.1 % (ref 43.0–77.0)
PLATELETS: 171 10*3/uL (ref 150.0–400.0)
RBC: 4.73 Mil/uL (ref 4.22–5.81)
RDW: 12.4 % (ref 11.5–15.5)
WBC: 5.3 10*3/uL (ref 4.0–10.5)

## 2018-03-08 LAB — BASIC METABOLIC PANEL
BUN: 18 mg/dL (ref 6–23)
CALCIUM: 9.3 mg/dL (ref 8.4–10.5)
CO2: 28 meq/L (ref 19–32)
CREATININE: 0.94 mg/dL (ref 0.40–1.50)
Chloride: 102 mEq/L (ref 96–112)
GFR: 85.87 mL/min (ref >60.00)
GLUCOSE: 102 mg/dL — AB (ref 70–99)
Potassium: 4.8 mEq/L (ref 3.5–5.1)
Sodium: 139 mEq/L (ref 135–145)

## 2018-03-08 LAB — HEPATIC FUNCTION PANEL
ALT: 23 U/L (ref 0–53)
AST: 15 U/L (ref 0–37)
Albumin: 4.4 g/dL (ref 3.5–5.2)
Alkaline Phosphatase: 62 U/L (ref 39–117)
Bilirubin, Direct: 0.1 mg/dL (ref 0.0–0.3)
TOTAL PROTEIN: 6.9 g/dL (ref 6.0–8.3)
Total Bilirubin: 0.8 mg/dL (ref 0.2–1.2)

## 2018-03-08 LAB — LIPID PANEL
CHOLESTEROL: 161 mg/dL (ref 0–200)
HDL: 49.8 mg/dL (ref >39.00)
LDL Cholesterol: 94 mg/dL (ref 0–99)
NonHDL: 111.14
TRIGLYCERIDES: 84 mg/dL (ref 0.0–149.0)
Total CHOL/HDL Ratio: 3
VLDL: 16.8 mg/dL (ref 0.0–40.0)

## 2018-03-08 LAB — TSH: TSH: 2.06 u[IU]/mL (ref 0.35–4.50)

## 2018-03-08 LAB — PSA: PSA: 2.43 ng/mL (ref 0.10–4.00)

## 2018-03-08 MED ORDER — DICLOFENAC SODIUM 75 MG PO TBEC: DELAYED_RELEASE_TABLET | ORAL | 3 refills | Status: DC

## 2018-03-08 NOTE — Progress Notes (Signed)
   Subjective:    Patient ID: [name], male    DOB: 05-22-1955, 62 y.o.   MRN: [id number]  HPI Here for a well exam. He feels great except for his arthritis. The Diclofenac still helps a lot. He can do all the things he wants to do.    Review of Systems  Constitutional: Negative.   HENT: Negative.   Eyes: Negative.   Respiratory: Negative.   Cardiovascular: Negative.   Gastrointestinal: Negative.   Genitourinary: Negative.   Musculoskeletal: Negative.   Skin: Negative.   Neurological: Negative.   Psychiatric/Behavioral: Negative.        Objective:   Physical Exam  Constitutional: He is oriented to person, place, and time. He appears well-developed and well-nourished. No distress.  HENT:  Head: Normocephalic and atraumatic.  Right Ear: External ear normal.  Left Ear: External ear normal.  Nose: Nose normal.  Mouth/Throat: Oropharynx is clear and moist. No oropharyngeal exudate.  Eyes: Pupils are equal, round, and reactive to light. Conjunctivae and EOM are normal. Right eye exhibits no discharge. Left eye exhibits no discharge. No scleral icterus.  Neck: Neck supple. No JVD present. No tracheal deviation present. No thyromegaly present.  Cardiovascular: Normal rate, regular rhythm, normal heart sounds and intact distal pulses. Exam reveals no gallop and no friction rub.  No murmur heard. Pulmonary/Chest: Effort normal and breath sounds normal. No respiratory distress. He has no wheezes. He has no rales. He exhibits no tenderness.  Abdominal: Soft. Bowel sounds are normal. He exhibits no distension and no mass. There is no tenderness. There is no rebound and no guarding.  Genitourinary: Rectum normal, prostate normal and penis normal. Rectal exam shows guaiac negative stool. No penile tenderness.  Musculoskeletal: Normal range of motion. He exhibits no edema or tenderness.  Lymphadenopathy:    He has no cervical adenopathy.  Neurological: He is alert and oriented to  person, place, and time. He has normal reflexes. He displays normal reflexes. No cranial nerve deficit. He exhibits normal muscle tone. Coordination normal.  Skin: Skin is warm and dry. No rash noted. He is not diaphoretic. No erythema. No pallor.  Psychiatric: He has a normal mood and affect. His behavior is normal. Judgment and thought content normal.          Assessment & Plan:  Well exam. We discussed diet and exercise. Get fasting labs.  Gershon Crane, MD

## 2018-03-12 ENCOUNTER — Encounter: Payer: Self-pay | Admitting: *Deleted

## 2019-02-17 ENCOUNTER — Ambulatory Visit (INDEPENDENT_AMBULATORY_CARE_PROVIDER_SITE_OTHER): Payer: Medicare HMO

## 2019-02-17 ENCOUNTER — Other Ambulatory Visit: Payer: Self-pay

## 2019-02-17 DIAGNOSIS — Z23 Encounter for immunization: Secondary | ICD-10-CM

## 2019-03-16 ENCOUNTER — Encounter: Payer: Self-pay | Admitting: Family Medicine

## 2019-03-16 ENCOUNTER — Ambulatory Visit (INDEPENDENT_AMBULATORY_CARE_PROVIDER_SITE_OTHER): Payer: Medicare HMO | Admitting: Family Medicine

## 2019-03-16 ENCOUNTER — Other Ambulatory Visit: Payer: Self-pay

## 2019-03-16 VITALS — BP 128/84 | HR 71 | Temp 98.2°F | Ht 68.0 in | Wt 187.4 lb

## 2019-03-16 DIAGNOSIS — Z0000 Encounter for general adult medical examination without abnormal findings: Secondary | ICD-10-CM | POA: Diagnosis not present

## 2019-03-16 DIAGNOSIS — Z23 Encounter for immunization: Secondary | ICD-10-CM

## 2019-03-16 LAB — HEPATIC FUNCTION PANEL
ALT: 18 U/L (ref 0–53)
AST: 16 U/L (ref 0–37)
Albumin: 4.2 g/dL (ref 3.5–5.2)
Alkaline Phosphatase: 56 U/L (ref 39–117)
Bilirubin, Direct: 0.2 mg/dL (ref 0.0–0.3)
Total Bilirubin: 1 mg/dL (ref 0.2–1.2)
Total Protein: 6.6 g/dL (ref 6.0–8.3)

## 2019-03-16 LAB — CBC WITH DIFFERENTIAL/PLATELET
Basophils Absolute: 0.1 10*3/uL (ref 0.0–0.1)
Basophils Relative: 1.1 % (ref 0.0–3.0)
Eosinophils Absolute: 0.1 10*3/uL (ref 0.0–0.7)
Eosinophils Relative: 1.7 % (ref 0.0–5.0)
HCT: 43.3 % (ref 39.0–52.0)
Hemoglobin: 15 g/dL (ref 13.0–17.0)
Lymphocytes Relative: 41.2 % (ref 12.0–46.0)
Lymphs Abs: 2 10*3/uL (ref 0.7–4.0)
MCHC: 34.6 g/dL (ref 30.0–36.0)
MCV: 96.7 fl (ref 78.0–100.0)
Monocytes Absolute: 0.6 10*3/uL (ref 0.1–1.0)
Monocytes Relative: 13.2 % — ABNORMAL HIGH (ref 3.0–12.0)
Neutro Abs: 2 10*3/uL (ref 1.4–7.7)
Neutrophils Relative %: 42.8 % — ABNORMAL LOW (ref 43.0–77.0)
Platelets: 160 10*3/uL (ref 150.0–400.0)
RBC: 4.47 Mil/uL (ref 4.22–5.81)
RDW: 12.2 % (ref 11.5–15.5)
WBC: 4.8 10*3/uL (ref 4.0–10.5)

## 2019-03-16 LAB — LIPID PANEL
Cholesterol: 186 mg/dL (ref 0–200)
HDL: 55.8 mg/dL (ref >39.00)
LDL Cholesterol: 113 mg/dL — ABNORMAL HIGH (ref 0–99)
NonHDL: 130.44
Total CHOL/HDL Ratio: 3
Triglycerides: 89 mg/dL (ref 0.0–149.0)
VLDL: 17.8 mg/dL (ref 0.0–40.0)

## 2019-03-16 LAB — BASIC METABOLIC PANEL
BUN: 17 mg/dL (ref 6–23)
CO2: 28 mEq/L (ref 19–32)
Calcium: 8.8 mg/dL (ref 8.4–10.5)
Chloride: 103 mEq/L (ref 96–112)
Creatinine, Ser: 0.81 mg/dL (ref 0.40–1.50)
GFR: 95.63 mL/min (ref >60.00)
Glucose, Bld: 113 mg/dL — ABNORMAL HIGH (ref 70–99)
Potassium: 4.3 mEq/L (ref 3.5–5.1)
Sodium: 138 mEq/L (ref 135–145)

## 2019-03-16 LAB — TSH: TSH: 1.73 u[IU]/mL (ref 0.35–4.50)

## 2019-03-16 LAB — PSA: PSA: 2.85 ng/mL (ref 0.10–4.00)

## 2019-03-16 MED ORDER — DICLOFENAC SODIUM 75 MG PO TBEC: DELAYED_RELEASE_TABLET | ORAL | 3 refills | Status: DC

## 2019-03-16 NOTE — Addendum Note (Signed)
Addended by: Gwenyth Ober R on: 03/16/2019 10:12 AM   Modules accepted: Orders

## 2019-03-16 NOTE — Progress Notes (Signed)
   Subjective:    Patient ID: [name], male    DOB: 06-11-1955, 63 y.o.   MRN: [id number]  HPI Here for a well exam. He feels fine except for joint pains, which are controlled by taking Diclofenac.    Review of Systems  Constitutional: Negative.   HENT: Negative.   Eyes: Negative.   Respiratory: Negative.   Cardiovascular: Negative.   Gastrointestinal: Negative.   Genitourinary: Negative.   Musculoskeletal: Positive for arthralgias.  Skin: Negative.   Neurological: Negative.   Psychiatric/Behavioral: Negative.        Objective:   Physical Exam Constitutional:      General: He is not in acute distress.    Appearance: He is well-developed. He is not diaphoretic.  HENT:     Head: Normocephalic and atraumatic.     Right Ear: External ear normal.     Left Ear: External ear normal.     Nose: Nose normal.     Mouth/Throat:     Pharynx: No oropharyngeal exudate.  Eyes:     General: No scleral icterus.       Right eye: No discharge.        Left eye: No discharge.     Conjunctiva/sclera: Conjunctivae normal.     Pupils: Pupils are equal, round, and reactive to light.  Neck:     Musculoskeletal: Neck supple.     Thyroid: No thyromegaly.     Vascular: No JVD.     Trachea: No tracheal deviation.  Cardiovascular:     Rate and Rhythm: Normal rate and regular rhythm.     Heart sounds: Normal heart sounds. No murmur. No friction rub. No gallop.   Pulmonary:     Effort: Pulmonary effort is normal. No respiratory distress.     Breath sounds: Normal breath sounds. No wheezing or rales.  Chest:     Chest wall: No tenderness.  Abdominal:     General: Bowel sounds are normal. There is no distension.     Palpations: Abdomen is soft. There is no mass.     Tenderness: There is no abdominal tenderness. There is no guarding or rebound.  Genitourinary:    Penis: Normal. No tenderness.      Scrotum/Testes: Normal.     Prostate: Normal.     Rectum: Normal. Guaiac result negative.   Musculoskeletal: Normal range of motion.        General: No tenderness.  Lymphadenopathy:     Cervical: No cervical adenopathy.  Skin:    General: Skin is warm and dry.     Coloration: Skin is not pale.     Findings: No erythema or rash.  Neurological:     Mental Status: He is alert and oriented to person, place, and time.     Cranial Nerves: No cranial nerve deficit.     Motor: No abnormal muscle tone.     Coordination: Coordination normal.     Deep Tendon Reflexes: Reflexes are normal and symmetric. Reflexes normal.  Psychiatric:        Behavior: Behavior normal.        Thought Content: Thought content normal.        Judgment: Judgment normal.           Assessment & Plan:  Well exam. We discussed diet and exercise. Get fasting labs. Alysia Penna, MD

## 2019-05-23 DIAGNOSIS — R69 Illness, unspecified: Secondary | ICD-10-CM | POA: Diagnosis not present

## 2019-05-25 ENCOUNTER — Ambulatory Visit: Payer: Medicare Other | Attending: Internal Medicine

## 2019-05-25 ENCOUNTER — Other Ambulatory Visit: Payer: Self-pay

## 2019-05-25 DIAGNOSIS — Z23 Encounter for immunization: Secondary | ICD-10-CM | POA: Insufficient documentation

## 2019-05-25 NOTE — Progress Notes (Signed)
   Covid-19 Vaccination Clinic  Name:  [name]    MRN: [id number] DOB: 1955/11/25  05/25/2019  Mr. [name] was observed post Covid-19 immunization for 15 minutes without incidence. He was provided with Vaccine Information Sheet and instruction to access the V-Safe system.   Mr. [name] was instructed to call 911 with any severe reactions post vaccine: Marland Kitchen Difficulty breathing  . Swelling of your face and throat  . A fast heartbeat  . A bad rash all over your body  . Dizziness and weakness    Immunizations Administered    Name Date Dose VIS Date Route   Pfizer COVID-19 Vaccine 05/25/2019  4:32 PM 0.3 mL 04/15/2019 Intramuscular   Manufacturer: [employer], [employer]   Lot: [id number]   NDC: [address]-0

## 2019-06-04 ENCOUNTER — Ambulatory Visit: Payer: Medicare HMO

## 2019-06-08 DIAGNOSIS — R69 Illness, unspecified: Secondary | ICD-10-CM | POA: Diagnosis not present

## 2019-06-15 ENCOUNTER — Ambulatory Visit: Payer: Medicare HMO | Attending: Internal Medicine

## 2019-06-15 ENCOUNTER — Ambulatory Visit: Payer: Medicare HMO

## 2019-06-15 DIAGNOSIS — Z23 Encounter for immunization: Secondary | ICD-10-CM

## 2019-06-15 NOTE — Progress Notes (Signed)
   Covid-19 Vaccination Clinic  Name:  CRISTINO DEGROFF    MRN: [id number] DOB: 1955/11/15  06/15/2019  Mr. Bartell was observed post Covid-19 immunization for 15 minutes without incidence. He was provided with Vaccine Information Sheet and instruction to access the V-Safe system.   Mr. Cashman was instructed to call 911 with any severe reactions post vaccine: Marland Kitchen Difficulty breathing  . Swelling of your face and throat  . A fast heartbeat  . A bad rash all over your body  . Dizziness and weakness    Immunizations Administered    Name Date Dose VIS Date Route   Pfizer COVID-19 Vaccine 06/15/2019  8:13 AM 0.3 mL 04/15/2019 Intramuscular   Manufacturer: [employer], [employer]   Lot: [id number]   NDC: [address]-7

## 2019-06-24 ENCOUNTER — Ambulatory Visit: Payer: Medicare HMO

## 2019-12-22 DIAGNOSIS — H524 Presbyopia: Secondary | ICD-10-CM | POA: Diagnosis not present

## 2019-12-27 DIAGNOSIS — Z0100 Encounter for examination of eyes and vision without abnormal findings: Secondary | ICD-10-CM | POA: Diagnosis not present

## 2020-01-08 DIAGNOSIS — R69 Illness, unspecified: Secondary | ICD-10-CM | POA: Diagnosis not present

## 2020-02-14 ENCOUNTER — Other Ambulatory Visit: Payer: Self-pay | Admitting: Family Medicine

## 2020-04-30 ENCOUNTER — Ambulatory Visit (INDEPENDENT_AMBULATORY_CARE_PROVIDER_SITE_OTHER): Payer: Medicare HMO | Admitting: Family Medicine

## 2020-04-30 ENCOUNTER — Other Ambulatory Visit: Payer: Self-pay

## 2020-04-30 ENCOUNTER — Encounter: Payer: Self-pay | Admitting: Family Medicine

## 2020-04-30 VITALS — BP 124/88 | HR 76 | Ht 69.0 in | Wt 192.0 lb

## 2020-04-30 DIAGNOSIS — Z0000 Encounter for general adult medical examination without abnormal findings: Secondary | ICD-10-CM | POA: Diagnosis not present

## 2020-04-30 DIAGNOSIS — R739 Hyperglycemia, unspecified: Secondary | ICD-10-CM | POA: Diagnosis not present

## 2020-04-30 NOTE — Progress Notes (Signed)
Subjective:    Patient ID: ROLLAN ROGER, male    DOB: 1955-07-21, 64 y.o.   MRN: [id number]  HPI Here for a well exam. He feels well.    Review of Systems  Constitutional: Negative.   HENT: Negative.   Eyes: Negative.   Respiratory: Negative.   Cardiovascular: Negative.   Gastrointestinal: Negative.   Genitourinary: Negative.   Musculoskeletal: Negative.   Skin: Negative.   Neurological: Negative.   Psychiatric/Behavioral: Negative.        Objective:   Physical Exam Constitutional:      General: He is not in acute distress.    Appearance: Normal appearance. He is well-developed and well-nourished. He is not diaphoretic.  HENT:     Head: Normocephalic and atraumatic.     Right Ear: External ear normal.     Left Ear: External ear normal.     Nose: Nose normal.     Mouth/Throat:     Mouth: Oropharynx is clear and moist.     Pharynx: No oropharyngeal exudate.  Eyes:     General: No scleral icterus.       Right eye: No discharge.        Left eye: No discharge.     Extraocular Movements: EOM normal.     Conjunctiva/sclera: Conjunctivae normal.     Pupils: Pupils are equal, round, and reactive to light.  Neck:     Thyroid: No thyromegaly.     Vascular: No JVD.     Trachea: No tracheal deviation.  Cardiovascular:     Rate and Rhythm: Normal rate and regular rhythm.     Pulses: Intact distal pulses.     Heart sounds: Normal heart sounds. No murmur heard. No friction rub. No gallop.   Pulmonary:     Effort: Pulmonary effort is normal. No respiratory distress.     Breath sounds: Normal breath sounds. No wheezing or rales.  Chest:     Chest wall: No tenderness.  Abdominal:     General: Bowel sounds are normal. There is no distension.     Palpations: Abdomen is soft. There is no mass.     Tenderness: There is no abdominal tenderness. There is no guarding or rebound.  Genitourinary:    Penis: Normal. No tenderness.      Prostate: Normal.     Rectum: Normal.  Guaiac result negative.  Musculoskeletal:        General: No tenderness or edema. Normal range of motion.     Cervical back: Neck supple.  Lymphadenopathy:     Cervical: No cervical adenopathy.  Skin:    General: Skin is warm and dry.     Coloration: Skin is not pale.     Findings: No erythema or rash.  Neurological:     Mental Status: He is alert and oriented to person, place, and time.     Cranial Nerves: No cranial nerve deficit.     Motor: No abnormal muscle tone.     Coordination: Coordination normal.     Deep Tendon Reflexes: Reflexes are normal and symmetric. Reflexes normal.  Psychiatric:        Mood and Affect: Mood and affect normal.        Behavior: Behavior normal.        Thought Content: Thought content normal.        Judgment: Judgment normal.           Assessment & Plan:  Well exam. We discussed diet and exercise.  Get fasting labs soon. Gershon Crane, MD

## 2020-05-01 ENCOUNTER — Other Ambulatory Visit (INDEPENDENT_AMBULATORY_CARE_PROVIDER_SITE_OTHER): Payer: Medicare HMO

## 2020-05-01 ENCOUNTER — Other Ambulatory Visit: Payer: Self-pay | Admitting: Family Medicine

## 2020-05-01 DIAGNOSIS — Z0000 Encounter for general adult medical examination without abnormal findings: Secondary | ICD-10-CM

## 2020-05-01 DIAGNOSIS — R739 Hyperglycemia, unspecified: Secondary | ICD-10-CM

## 2020-05-01 LAB — LIPID PANEL
Cholesterol: 177 mg/dL (ref 0–200)
HDL: 46.2 mg/dL (ref >39.00)
LDL Cholesterol: 112 mg/dL — ABNORMAL HIGH (ref 0–99)
NonHDL: 130.77
Total CHOL/HDL Ratio: 4
Triglycerides: 95 mg/dL (ref 0.0–149.0)
VLDL: 19 mg/dL (ref 0.0–40.0)

## 2020-05-01 LAB — BASIC METABOLIC PANEL
BUN: 28 mg/dL — ABNORMAL HIGH (ref 6–23)
CO2: 29 mEq/L (ref 19–32)
Calcium: 8.8 mg/dL (ref 8.4–10.5)
Chloride: 103 mEq/L (ref 96–112)
Creatinine, Ser: 0.88 mg/dL (ref 0.40–1.50)
GFR: 89.83 mL/min (ref >60.00)
Glucose, Bld: 113 mg/dL — ABNORMAL HIGH (ref 70–99)
Potassium: 4.7 mEq/L (ref 3.5–5.1)
Sodium: 138 mEq/L (ref 135–145)

## 2020-05-01 LAB — CBC WITH DIFFERENTIAL/PLATELET
Basophils Absolute: 0 10*3/uL (ref 0.0–0.1)
Basophils Relative: 0.9 % (ref 0.0–3.0)
Eosinophils Absolute: 0.1 10*3/uL (ref 0.0–0.7)
Eosinophils Relative: 3 % (ref 0.0–5.0)
HCT: 42.9 % (ref 39.0–52.0)
Hemoglobin: 14.7 g/dL (ref 13.0–17.0)
Lymphocytes Relative: 42.2 % (ref 12.0–46.0)
Lymphs Abs: 2 10*3/uL (ref 0.7–4.0)
MCHC: 34.3 g/dL (ref 30.0–36.0)
MCV: 95.7 fl (ref 78.0–100.0)
Monocytes Absolute: 0.6 10*3/uL (ref 0.1–1.0)
Monocytes Relative: 13 % — ABNORMAL HIGH (ref 3.0–12.0)
Neutro Abs: 1.9 10*3/uL (ref 1.4–7.7)
Neutrophils Relative %: 40.9 % — ABNORMAL LOW (ref 43.0–77.0)
Platelets: 163 10*3/uL (ref 150.0–400.0)
RBC: 4.48 Mil/uL (ref 4.22–5.81)
RDW: 12.6 % (ref 11.5–15.5)
WBC: 4.7 10*3/uL (ref 4.0–10.5)

## 2020-05-01 LAB — HEPATIC FUNCTION PANEL
ALT: 23 U/L (ref 0–53)
AST: 17 U/L (ref 0–37)
Albumin: 4.1 g/dL (ref 3.5–5.2)
Alkaline Phosphatase: 54 U/L (ref 39–117)
Bilirubin, Direct: 0.1 mg/dL (ref 0.0–0.3)
Total Bilirubin: 0.7 mg/dL (ref 0.2–1.2)
Total Protein: 6.6 g/dL (ref 6.0–8.3)

## 2020-05-01 LAB — TSH: TSH: 2.3 u[IU]/mL (ref 0.35–4.50)

## 2020-05-01 LAB — PSA: PSA: 2.75 ng/mL (ref 0.10–4.00)

## 2020-05-01 LAB — HEMOGLOBIN A1C: Hgb A1c MFr Bld: 5.3 % (ref 4.6–6.5)

## 2020-05-01 NOTE — Addendum Note (Signed)
Addended by: Lerry Liner on: 05/01/2020 07:35 AM   Modules accepted: Orders

## 2020-05-05 DEATH — deceased

## 2020-07-19 ENCOUNTER — Telehealth: Payer: Self-pay | Admitting: Family Medicine

## 2020-07-19 NOTE — Telephone Encounter (Signed)
Spoke to patient to schedule Medicare Annual Wellness Visit (AWV) either virtually or in office. He will call back when he can get to his calender   AWVI  please schedule at anytime with [facility] Nurse Health Advisor 1 or 2   This should be a 45 minute visit.

## 2020-10-12 ENCOUNTER — Ambulatory Visit (INDEPENDENT_AMBULATORY_CARE_PROVIDER_SITE_OTHER): Payer: Medicare HMO

## 2020-10-12 DIAGNOSIS — Z0000 Encounter for general adult medical examination without abnormal findings: Secondary | ICD-10-CM | POA: Diagnosis not present

## 2020-10-12 NOTE — Patient Instructions (Signed)
[name] , Thank you for taking time to come for your Medicare Wellness Visit. I appreciate your ongoing commitment to your health goals. Please review the following plan we discussed and let me know if I can assist you in the future.   Screening recommendations/referrals: Colonoscopy: pt states he had colonoscopy he will bring in dates next ov  Recommended yearly ophthalmology/optometry visit for glaucoma screening and checkup Recommended yearly dental visit for hygiene and checkup  Vaccinations: Influenza vaccine: due in fall 2022 Pneumococcal vaccine: completed series  Tdap vaccine: 01/03/2014  due 2025 Shingles vaccine: completed series   Advanced directives:  will provide copies   Conditions/risks identified: none   Next appointment: none   Preventive Care 65 Years and Older, Male Preventive care refers to lifestyle choices and visits with your health care provider that can promote health and wellness. What does preventive care include? A yearly physical exam. This is also called an annual well check. Dental exams once or twice a year. Routine eye exams. Ask your health care provider how often you should have your eyes checked. Personal lifestyle choices, including: Daily care of your teeth and gums. Regular physical activity. Eating a healthy diet. Avoiding tobacco and drug use. Limiting alcohol use. Practicing safe sex. Taking low doses of aspirin every day. Taking vitamin and mineral supplements as recommended by your health care provider. What happens during an annual well check? The services and screenings done by your health care provider during your annual well check will depend on your age, overall health, lifestyle risk factors, and family history of disease. Counseling  Your health care provider may ask you questions about your: Alcohol use. Tobacco use. Drug use. Emotional well-being. Home and relationship well-being. Sexual activity. Eating  habits. History of falls. Memory and ability to understand (cognition). Work and work Astronomer. Screening  You may have the following tests or measurements: Height, weight, and BMI. Blood pressure. Lipid and cholesterol levels. These may be checked every 5 years, or more frequently if you are over 21 years old. Skin check. Lung cancer screening. You may have this screening every year starting at age 59 if you have a 30-pack-year history of smoking and currently smoke or have quit within the past 15 years. Fecal occult blood test (FOBT) of the stool. You may have this test every year starting at age 21. Flexible sigmoidoscopy or colonoscopy. You may have a sigmoidoscopy every 5 years or a colonoscopy every 10 years starting at age 19. Prostate cancer screening. Recommendations will vary depending on your family history and other risks. Hepatitis C blood test. Hepatitis B blood test. Sexually transmitted disease (STD) testing. Diabetes screening. This is done by checking your blood sugar (glucose) after you have not eaten for a while (fasting). You may have this done every 1-3 years. Abdominal aortic aneurysm (AAA) screening. You may need this if you are a current or former smoker. Osteoporosis. You may be screened starting at age 60 if you are at high risk. Talk with your health care provider about your test results, treatment options, and if necessary, the need for more tests. Vaccines  Your health care provider may recommend certain vaccines, such as: Influenza vaccine. This is recommended every year. Tetanus, diphtheria, and acellular pertussis (Tdap, Td) vaccine. You may need a Td booster every 10 years. Zoster vaccine. You may need this after age 106. Pneumococcal 13-valent conjugate (PCV13) vaccine. One dose is recommended after age 65. Pneumococcal polysaccharide (PPSV23) vaccine. One dose is recommended after  age 64. Talk to your health care provider about which screenings and  vaccines you need and how often you need them. This information is not intended to replace advice given to you by your health care provider. Make sure you discuss any questions you have with your health care provider. Document Released: 05/18/2015 Document Revised: 01/09/2016 Document Reviewed: 02/20/2015 Elsevier Interactive Patient Education  2017 [place] Prevention in the Home Falls can cause injuries. They can happen to people of all ages. There are many things you can do to make your home safe and to help prevent falls. What can I do on the outside of my home? Regularly fix the edges of walkways and driveways and fix any cracks. Remove anything that might make you trip as you walk through a door, such as a raised step or threshold. Trim any bushes or trees on the path to your home. Use bright outdoor lighting. Clear any walking paths of anything that might make someone trip, such as rocks or tools. Regularly check to see if handrails are loose or broken. Make sure that both sides of any steps have handrails. Any raised decks and porches should have guardrails on the edges. Have any leaves, snow, or ice cleared regularly. Use sand or salt on walking paths during winter. Clean up any spills in your garage right away. This includes oil or grease spills. What can I do in the bathroom? Use night lights. Install grab bars by the toilet and in the tub and shower. Do not use towel bars as grab bars. Use non-skid mats or decals in the tub or shower. If you need to sit down in the shower, use a plastic, non-slip stool. Keep the floor dry. Clean up any water that spills on the floor as soon as it happens. Remove soap buildup in the tub or shower regularly. Attach bath mats securely with double-sided non-slip rug tape. Do not have throw rugs and other things on the floor that can make you trip. What can I do in the bedroom? Use night lights. Make sure that you have a light by your  bed that is easy to reach. Do not use any sheets or blankets that are too big for your bed. They should not hang down onto the floor. Have a firm chair that has side arms. You can use this for support while you get dressed. Do not have throw rugs and other things on the floor that can make you trip. What can I do in the kitchen? Clean up any spills right away. Avoid walking on wet floors. Keep items that you use a lot in easy-to-reach places. If you need to reach something above you, use a strong step stool that has a grab bar. Keep electrical cords out of the way. Do not use floor polish or wax that makes floors slippery. If you must use wax, use non-skid floor wax. Do not have throw rugs and other things on the floor that can make you trip. What can I do with my stairs? Do not leave any items on the stairs. Make sure that there are handrails on both sides of the stairs and use them. Fix handrails that are broken or loose. Make sure that handrails are as long as the stairways. Check any carpeting to make sure that it is firmly attached to the stairs. Fix any carpet that is loose or worn. Avoid having throw rugs at the top or bottom of the stairs. If you do  have throw rugs, attach them to the floor with carpet tape. Make sure that you have a light switch at the top of the stairs and the bottom of the stairs. If you do not have them, ask someone to add them for you. What else can I do to help prevent falls? Wear shoes that: Do not have high heels. Have rubber bottoms. Are comfortable and fit you well. Are closed at the toe. Do not wear sandals. If you use a stepladder: Make sure that it is fully opened. Do not climb a closed stepladder. Make sure that both sides of the stepladder are locked into place. Ask someone to hold it for you, if possible. Clearly mark and make sure that you can see: Any grab bars or handrails. First and last steps. Where the edge of each step is. Use tools that  help you move around (mobility aids) if they are needed. These include: Canes. Walkers. Scooters. Crutches. Turn on the lights when you go into a dark area. Replace any light bulbs as soon as they burn out. Set up your furniture so you have a clear path. Avoid moving your furniture around. If any of your floors are uneven, fix them. If there are any pets around you, be aware of where they are. Review your medicines with your doctor. Some medicines can make you feel dizzy. This can increase your chance of falling. Ask your doctor what other things that you can do to help prevent falls. This information is not intended to replace advice given to you by your health care provider. Make sure you discuss any questions you have with your health care provider. Document Released: 02/15/2009 Document Revised: 09/27/2015 Document Reviewed: 05/26/2014 Elsevier Interactive Patient Education  2017 [employer].

## 2020-10-12 NOTE — Progress Notes (Signed)
Subjective:   [name] is a 65 y.o. male who presents for an Initial Medicare Annual Wellness Visit. I connected with [name] today by telephone and verified that I am speaking with the correct person using two identifiers. Location patient: home Location provider: work Persons participating in the virtual visit: patient, provider.   I discussed the limitations, risks, security and privacy concerns of performing an evaluation and management service by telephone and the availability of in person appointments. I also discussed with the patient that there may be a patient responsible charge related to this service. The patient expressed understanding and verbally consented to this telephonic visit.    Interactive audio and video telecommunications were attempted between this provider and patient, however failed, due to patient having technical difficulties OR patient did not have access to video capability.  We continued and completed visit with audio only.     Review of Systems   n/a       Objective:    There were no vitals filed for this visit. There is no height or weight on file to calculate BMI.  No flowsheet data found.  Current Medications (verified) Outpatient Encounter Medications as of 10/12/2020  Medication Sig   diclofenac (VOLTAREN) 75 MG EC tablet TAKE 1 TABLET BY MOUTH TWICE A DAY   No facility-administered encounter medications on file as of 10/12/2020.    Allergies (verified) Sulfonamide derivatives   History: Past Medical History:  Diagnosis Date   Knee pain    right   Pneumothorax    small left   Rib fractures    left   Scapula fracture    left   Past Surgical History:  Procedure Laterality Date   COLONOSCOPY  08/2015   normal, repeat in 10 yrs    KNEE SURGERY     right, partial replacement, per  Dr. Ollen Gross   No family history on file. Social History   Socioeconomic History   Marital status: Married    Spouse name: Not on  file   Number of children: Not on file   Years of education: Not on file   Highest education level: Not on file  Occupational History   Not on file  Tobacco Use   Smoking status: Never   Smokeless tobacco: Never  Substance and Sexual Activity   Alcohol use: Yes    Alcohol/week: 0.0 standard drinks    Comment: glass of wine daily   Drug use: No   Sexual activity: Not on file  Other Topics Concern   Not on file  Social History Narrative   Not on file   Social Determinants of Health   Financial Resource Strain: Not on file  Food Insecurity: Not on file  Transportation Needs: Not on file  Physical Activity: Not on file  Stress: Not on file  Social Connections: Not on file    Tobacco Counseling Counseling given: Not Answered   Clinical Intake:                 Diabetic?no         Activities of Daily Living No flowsheet data found.  Patient Care Team: Nelwyn Salisbury, MD as PCP - General (Family Medicine)  Indicate any recent Medical Services you may have received from other than [facility] providers in the past year (date may be approximate).     Assessment:   This is a routine wellness examination for [name].  Hearing/Vision screen No results found.  Dietary issues  and exercise activities discussed:     Goals Addressed   None    Depression Screen PHQ 2/9 Scores 04/30/2020 03/08/2018  PHQ - 2 Score 0 0  PHQ- 9 Score 0 -    Fall Risk Fall Risk  04/30/2020 03/08/2018  Falls in the past year? 0 0  Number falls in past yr: 0 -  Injury with Fall? 0 -    FALL RISK PREVENTION PERTAINING TO THE HOME:  Any stairs in or around the home? Yes  If so, are there any without handrails? No  Home free of loose throw rugs in walkways, pet beds, electrical cords, etc? Yes  Adequate lighting in your home to reduce risk of falls? Yes   ASSISTIVE DEVICES UTILIZED TO PREVENT FALLS:  Life alert? No  Use of a cane, walker or w/c? No  Grab bars in the bathroom?  No  Shower chair or bench in shower? Yes  Elevated toilet seat or a handicapped toilet? Yes   Cognitive Function:  Normal cognitive status assessed by direct observation by this Nurse Health Advisor. No abnormalities found.        Immunizations Immunization History  Administered Date(s) Administered   Influenza Split 01/13/2012   Influenza,inj,Quad PF,6+ Mos 02/04/2013, 01/03/2014, 01/15/2015, 02/27/2016, 02/27/2017, 03/08/2018, 02/17/2019   Influenza-Unspecified 04/06/2017   PFIZER(Purple Top)SARS-COV-2 Vaccination 05/25/2019, 06/15/2019   Pneumococcal Conjugate-13 03/16/2019   Td 05/05/2001   Tdap 01/03/2014   Zoster Recombinat (Shingrix) 02/27/2017, 04/30/2017   Zoster, Live 01/15/2015    TDAP status: Up to date  Flu Vaccine status: Up to date  Pneumococcal vaccine status: Up to date  Covid-19 vaccine status: Completed vaccines  Qualifies for Shingles Vaccine? Yes   Zostavax completed No   Shingrix Completed?: Yes  Screening Tests Health Maintenance  Topic Date Due   Hepatitis C Screening  Never done   COLONOSCOPY (Pts 45-81yrs Insurance coverage will need to be confirmed)  Never done   COVID-19 Vaccine (3 - Booster for Pfizer series) 11/12/2019   PNA vac Low Risk Adult (2 of 2 - PPSV23) 03/15/2020   INFLUENZA VACCINE  12/03/2020   TETANUS/TDAP  01/04/2024   Zoster Vaccines- Shingrix  Completed   HPV VACCINES  Aged Out    Health Maintenance  Health Maintenance Due  Topic Date Due   Hepatitis C Screening  Never done   COLONOSCOPY (Pts 45-50yrs Insurance coverage will need to be confirmed)  Never done   COVID-19 Vaccine (3 - Booster for Pfizer series) 11/12/2019   PNA vac Low Risk Adult (2 of 2 - PPSV23) 03/15/2020    Colorectal cancer screening: Type of screening: Colonoscopy. Completed pt states he has had colonoscopy will bring in dates .Marland Kitchen Repeat every 2013 years  Lung Cancer Screening: (Low Dose CT Chest recommended if Age 64-80 years, 30 pack-year  currently smoking OR have quit w/in 15years.) does not qualify.   Lung Cancer Screening Referral: n/a  Additional Screening:  Hepatitis C Screening: does qualify  Vision Screening: Recommended annual ophthalmology exams for early detection of glaucoma and other disorders of the eye. Is the patient up to date with their annual eye exam?  Yes  Who is the provider or what is the name of the office in which the patient attends annual eye exams? Dr.McFarland  If pt is not established with a provider, would they like to be referred to a provider to establish care? No .   Dental Screening: Recommended annual dental exams for proper oral hygiene  [employer]  Referral / Chronic Care Management: CRR required this visit?  No   CCM required this visit?  No      Plan:     I have personally reviewed and noted the following in the patient's chart:   Medical and social history Use of alcohol, tobacco or illicit drugs  Current medications and supplements including opioid prescriptions. Patient is not currently taking opioid prescriptions. Functional ability and status Nutritional status Physical activity Advanced directives List of other physicians Hospitalizations, surgeries, and ER visits in previous 12 months Vitals Screenings to include cognitive, depression, and falls Referrals and appointments  In addition, I have reviewed and discussed with patient certain preventive protocols, quality metrics, and best practice recommendations. A written personalized care plan for preventive services as well as general preventive health recommendations were provided to patient.     March Rummage, LPN   0/24/0973   Nurse Notes: none

## 2021-01-14 DIAGNOSIS — M25511 Pain in right shoulder: Secondary | ICD-10-CM | POA: Diagnosis not present

## 2021-01-21 DIAGNOSIS — M25511 Pain in right shoulder: Secondary | ICD-10-CM | POA: Diagnosis not present

## 2021-02-05 DIAGNOSIS — M75120 Complete rotator cuff tear or rupture of unspecified shoulder, not specified as traumatic: Secondary | ICD-10-CM | POA: Diagnosis not present

## 2021-02-09 ENCOUNTER — Other Ambulatory Visit: Payer: Self-pay | Admitting: Family Medicine

## 2021-03-27 DIAGNOSIS — H5203 Hypermetropia, bilateral: Secondary | ICD-10-CM | POA: Diagnosis not present

## 2021-03-27 DIAGNOSIS — H2513 Age-related nuclear cataract, bilateral: Secondary | ICD-10-CM | POA: Diagnosis not present

## 2021-03-27 DIAGNOSIS — Z135 Encounter for screening for eye and ear disorders: Secondary | ICD-10-CM | POA: Diagnosis not present

## 2021-03-27 DIAGNOSIS — H524 Presbyopia: Secondary | ICD-10-CM | POA: Diagnosis not present

## 2021-03-27 DIAGNOSIS — H52223 Regular astigmatism, bilateral: Secondary | ICD-10-CM | POA: Diagnosis not present

## 2021-05-01 ENCOUNTER — Ambulatory Visit (INDEPENDENT_AMBULATORY_CARE_PROVIDER_SITE_OTHER): Payer: Medicare HMO | Admitting: Family Medicine

## 2021-05-01 ENCOUNTER — Telehealth: Payer: Self-pay | Admitting: Family Medicine

## 2021-05-01 ENCOUNTER — Encounter: Payer: Self-pay | Admitting: Family Medicine

## 2021-05-01 VITALS — BP 120/70 | HR 63 | Temp 97.7°F | Ht 70.0 in | Wt 187.0 lb

## 2021-05-01 DIAGNOSIS — G8929 Other chronic pain: Secondary | ICD-10-CM | POA: Diagnosis not present

## 2021-05-01 DIAGNOSIS — M25511 Pain in right shoulder: Secondary | ICD-10-CM | POA: Diagnosis not present

## 2021-05-01 DIAGNOSIS — J301 Allergic rhinitis due to pollen: Secondary | ICD-10-CM

## 2021-05-01 DIAGNOSIS — N401 Enlarged prostate with lower urinary tract symptoms: Secondary | ICD-10-CM | POA: Diagnosis not present

## 2021-05-01 DIAGNOSIS — N138 Other obstructive and reflux uropathy: Secondary | ICD-10-CM

## 2021-05-01 DIAGNOSIS — R739 Hyperglycemia, unspecified: Secondary | ICD-10-CM | POA: Diagnosis not present

## 2021-05-01 DIAGNOSIS — R972 Elevated prostate specific antigen [PSA]: Secondary | ICD-10-CM | POA: Diagnosis not present

## 2021-05-01 DIAGNOSIS — Z23 Encounter for immunization: Secondary | ICD-10-CM

## 2021-05-01 DIAGNOSIS — E785 Hyperlipidemia, unspecified: Secondary | ICD-10-CM

## 2021-05-01 LAB — HEPATIC FUNCTION PANEL
ALT: 37 U/L (ref 0–53)
AST: 26 U/L (ref 0–37)
Albumin: 4 g/dL (ref 3.5–5.2)
Alkaline Phosphatase: 72 U/L (ref 39–117)
Bilirubin, Direct: 0.1 mg/dL (ref 0.0–0.3)
Total Bilirubin: 0.8 mg/dL (ref 0.2–1.2)
Total Protein: 6.7 g/dL (ref 6.0–8.3)

## 2021-05-01 LAB — CBC WITH DIFFERENTIAL/PLATELET
Basophils Absolute: 0 10*3/uL (ref 0.0–0.1)
Basophils Relative: 0.8 % (ref 0.0–3.0)
Eosinophils Absolute: 0.1 10*3/uL (ref 0.0–0.7)
Eosinophils Relative: 2.3 % (ref 0.0–5.0)
HCT: 44.6 % (ref 39.0–52.0)
Hemoglobin: 15.2 g/dL (ref 13.0–17.0)
Lymphocytes Relative: 36.7 % (ref 12.0–46.0)
Lymphs Abs: 2.2 10*3/uL (ref 0.7–4.0)
MCHC: 34 g/dL (ref 30.0–36.0)
MCV: 94.9 fl (ref 78.0–100.0)
Monocytes Absolute: 0.9 10*3/uL (ref 0.1–1.0)
Monocytes Relative: 15 % — ABNORMAL HIGH (ref 3.0–12.0)
Neutro Abs: 2.8 10*3/uL (ref 1.4–7.7)
Neutrophils Relative %: 45.2 % (ref 43.0–77.0)
Platelets: 221 10*3/uL (ref 150.0–400.0)
RBC: 4.7 Mil/uL (ref 4.22–5.81)
RDW: 12.6 % (ref 11.5–15.5)
WBC: 6.1 10*3/uL (ref 4.0–10.5)

## 2021-05-01 LAB — HEMOGLOBIN A1C: Hgb A1c MFr Bld: 5.6 % (ref 4.6–6.5)

## 2021-05-01 LAB — BASIC METABOLIC PANEL
BUN: 16 mg/dL (ref 6–23)
CO2: 30 mEq/L (ref 19–32)
Calcium: 9.2 mg/dL (ref 8.4–10.5)
Chloride: 101 mEq/L (ref 96–112)
Creatinine, Ser: 0.91 mg/dL (ref 0.40–1.50)
GFR: 87.43 mL/min (ref >60.00)
Glucose, Bld: 116 mg/dL — ABNORMAL HIGH (ref 70–99)
Potassium: 5.5 mEq/L — ABNORMAL HIGH (ref 3.5–5.1)
Sodium: 139 mEq/L (ref 135–145)

## 2021-05-01 LAB — LIPID PANEL
Cholesterol: 179 mg/dL (ref 0–200)
HDL: 44.8 mg/dL (ref >39.00)
LDL Cholesterol: 115 mg/dL — ABNORMAL HIGH (ref 0–99)
NonHDL: 134.15
Total CHOL/HDL Ratio: 4
Triglycerides: 97 mg/dL (ref 0.0–149.0)
VLDL: 19.4 mg/dL (ref 0.0–40.0)

## 2021-05-01 LAB — TSH: TSH: 2.65 u[IU]/mL (ref 0.35–5.50)

## 2021-05-01 LAB — PSA: PSA: 7.82 ng/mL — ABNORMAL HIGH (ref 0.10–4.00)

## 2021-05-01 NOTE — Telephone Encounter (Signed)
Pt was seen today and he is still waiting on zpak for his sinus infection to be sent to   [id number] [place], [place] - [address] Phone:  [phone]  Fax:  [phone]

## 2021-05-01 NOTE — Progress Notes (Signed)
Subjective:    Patient ID: [name], male    DOB: 17-Jun-1955, 65 y.o.   MRN: [id number]  HPI Here to follow up on issues. He feels well except for joint pains. His main problem lately has been with his right shoulder. He saw Dr. Darrelyn Hillock who ordered an MRI. This showed advanced OA and a rotator cuff tear. At some point Mellody Dance will require a total replacement.  He stays acitve, and he takes Ibuprofen as needed. His allergies have been stable.   Review of Systems  Constitutional: Negative.   HENT: Negative.    Eyes: Negative.   Respiratory: Negative.    Cardiovascular: Negative.   Gastrointestinal: Negative.   Genitourinary: Negative.   Musculoskeletal:  Positive for arthralgias.  Skin: Negative.   Neurological: Negative.   Psychiatric/Behavioral: Negative.        Objective:   Physical Exam Constitutional:      General: He is not in acute distress.    Appearance: Normal appearance. He is well-developed. He is not diaphoretic.  HENT:     Head: Normocephalic and atraumatic.     Right Ear: External ear normal.     Left Ear: External ear normal.     Nose: Nose normal.     Mouth/Throat:     Pharynx: No oropharyngeal exudate.  Eyes:     General: No scleral icterus.       Right eye: No discharge.        Left eye: No discharge.     Conjunctiva/sclera: Conjunctivae normal.     Pupils: Pupils are equal, round, and reactive to light.  Neck:     Thyroid: No thyromegaly.     Vascular: No JVD.     Trachea: No tracheal deviation.  Cardiovascular:     Rate and Rhythm: Normal rate and regular rhythm.     Heart sounds: Normal heart sounds. No murmur heard.   No friction rub. No gallop.  Pulmonary:     Effort: Pulmonary effort is normal. No respiratory distress.     Breath sounds: Normal breath sounds. No wheezing or rales.  Chest:     Chest wall: No tenderness.  Abdominal:     General: Bowel sounds are normal. There is no distension.     Palpations: Abdomen is soft. There is  no mass.     Tenderness: There is no abdominal tenderness. There is no guarding or rebound.  Genitourinary:    Penis: Normal. No tenderness.      Testes: Normal.     Prostate: Normal.     Rectum: Normal. Guaiac result negative.  Musculoskeletal:        General: No tenderness. Normal range of motion.     Cervical back: Neck supple.  Lymphadenopathy:     Cervical: No cervical adenopathy.  Skin:    General: Skin is warm and dry.     Coloration: Skin is not pale.     Findings: No erythema or rash.  Neurological:     Mental Status: He is alert and oriented to person, place, and time.     Cranial Nerves: No cranial nerve deficit.     Motor: No abnormal muscle tone.     Coordination: Coordination normal.     Deep Tendon Reflexes: Reflexes are normal and symmetric. Reflexes normal.  Psychiatric:        Behavior: Behavior normal.        Thought Content: Thought content normal.        Judgment: Judgment  normal.          Assessment & Plan:  His OA is stable. He will monitor the right shoulder pain. Get fasting labs today to check lipids, etc. Given a pneumococcal vaccine. We spent a total of ( 32  ) minutes reviewing records and discussing these issues.  Gershon Crane, MD

## 2021-05-01 NOTE — Addendum Note (Signed)
Addended by: Carola Rhine on: 05/01/2021 09:51 AM   Modules accepted: Orders

## 2021-05-02 MED ORDER — AZITHROMYCIN 250 MG PO TABS: ORAL_TABLET | ORAL | 0 refills | Status: AC

## 2021-05-02 NOTE — Telephone Encounter (Signed)
Call in a Zpack  ?

## 2021-05-02 NOTE — Addendum Note (Signed)
Addended by: Gershon Crane A on: 05/02/2021 08:18 AM   Modules accepted: Orders

## 2021-05-02 NOTE — Telephone Encounter (Signed)
Patient aware, [facility]

## 2021-05-10 ENCOUNTER — Other Ambulatory Visit: Payer: Self-pay | Admitting: Family Medicine

## 2021-08-08 ENCOUNTER — Other Ambulatory Visit: Payer: Self-pay | Admitting: Family Medicine

## 2021-08-29 DIAGNOSIS — R972 Elevated prostate specific antigen [PSA]: Secondary | ICD-10-CM | POA: Diagnosis not present

## 2021-10-09 ENCOUNTER — Telehealth: Payer: Self-pay | Admitting: Family Medicine

## 2021-10-09 NOTE — Telephone Encounter (Signed)
Left message for patient to call back and schedule Medicare Annual Wellness Visit (AWV) either virtually or in office. Left  my jabber number [phone]   Last AWV ;10/12/20  please schedule at anytime with [facility] Nurse Health Advisor 1 or 2    

## 2021-10-14 ENCOUNTER — Ambulatory Visit (INDEPENDENT_AMBULATORY_CARE_PROVIDER_SITE_OTHER): Payer: Medicare HMO

## 2021-10-14 VITALS — Ht 69.0 in | Wt 180.0 lb

## 2021-10-14 DIAGNOSIS — Z0000 Encounter for general adult medical examination without abnormal findings: Secondary | ICD-10-CM

## 2021-10-14 NOTE — Patient Instructions (Signed)
[name] , Thank you for taking time to come for your Medicare Wellness Visit. I appreciate your ongoing commitment to your health goals. Please review the following plan we discussed and let me know if I can assist you in the future.   Screening recommendations/referrals: Colonoscopy: states not due yet Recommended yearly ophthalmology/optometry visit for glaucoma screening and checkup Recommended yearly dental visit for hygiene and checkup  Vaccinations: Influenza vaccine: due 12/03/2021 Pneumococcal vaccine: completed 05/01/2021 Tdap vaccine: completed 01/03/2014, due 01/04/2024 Shingles vaccine: completed   Covid-19: 01/15/2021, 06/15/2019, 05/25/2019  Advanced directives: Please bring a copy of your POA (Power of Attorney) and/or Living Will to your next appointment.   Conditions/risks identified: none  Next appointment: Follow up in one year for your annual wellness visit.   Preventive Care 68 Years and Older, Male Preventive care refers to lifestyle choices and visits with your health care provider that can promote health and wellness. What does preventive care include? A yearly physical exam. This is also called an annual well check. Dental exams once or twice a year. Routine eye exams. Ask your health care provider how often you should have your eyes checked. Personal lifestyle choices, including: Daily care of your teeth and gums. Regular physical activity. Eating a healthy diet. Avoiding tobacco and drug use. Limiting alcohol use. Practicing safe sex. Taking low doses of aspirin every day. Taking vitamin and mineral supplements as recommended by your health care provider. What happens during an annual well check? The services and screenings done by your health care provider during your annual well check will depend on your age, overall health, lifestyle risk factors, and family history of disease. Counseling  Your health care provider may ask you questions about  your: Alcohol use. Tobacco use. Drug use. Emotional well-being. Home and relationship well-being. Sexual activity. Eating habits. History of falls. Memory and ability to understand (cognition). Work and work Astronomer. Screening  You may have the following tests or measurements: Height, weight, and BMI. Blood pressure. Lipid and cholesterol levels. These may be checked every 5 years, or more frequently if you are over 52 years old. Skin check. Lung cancer screening. You may have this screening every year starting at age 20 if you have a 30-pack-year history of smoking and currently smoke or have quit within the past 15 years. Fecal occult blood test (FOBT) of the stool. You may have this test every year starting at age 45. Flexible sigmoidoscopy or colonoscopy. You may have a sigmoidoscopy every 5 years or a colonoscopy every 10 years starting at age 68. Prostate cancer screening. Recommendations will vary depending on your family history and other risks. Hepatitis C blood test. Hepatitis B blood test. Sexually transmitted disease (STD) testing. Diabetes screening. This is done by checking your blood sugar (glucose) after you have not eaten for a while (fasting). You may have this done every 1-3 years. Abdominal aortic aneurysm (AAA) screening. You may need this if you are a current or former smoker. Osteoporosis. You may be screened starting at age 36 if you are at high risk. Talk with your health care provider about your test results, treatment options, and if necessary, the need for more tests. Vaccines  Your health care provider may recommend certain vaccines, such as: Influenza vaccine. This is recommended every year. Tetanus, diphtheria, and acellular pertussis (Tdap, Td) vaccine. You may need a Td booster every 10 years. Zoster vaccine. You may need this after age 80. Pneumococcal 13-valent conjugate (PCV13) vaccine. One dose is  recommended after age 68. Pneumococcal  polysaccharide (PPSV23) vaccine. One dose is recommended after age 43. Talk to your health care provider about which screenings and vaccines you need and how often you need them. This information is not intended to replace advice given to you by your health care provider. Make sure you discuss any questions you have with your health care provider. Document Released: 05/18/2015 Document Revised: 01/09/2016 Document Reviewed: 02/20/2015 Elsevier Interactive Patient Education  2017 [place] Prevention in the Home Falls can cause injuries. They can happen to people of all ages. There are many things you can do to make your home safe and to help prevent falls. What can I do on the outside of my home? Regularly fix the edges of walkways and driveways and fix any cracks. Remove anything that might make you trip as you walk through a door, such as a raised step or threshold. Trim any bushes or trees on the path to your home. Use bright outdoor lighting. Clear any walking paths of anything that might make someone trip, such as rocks or tools. Regularly check to see if handrails are loose or broken. Make sure that both sides of any steps have handrails. Any raised decks and porches should have guardrails on the edges. Have any leaves, snow, or ice cleared regularly. Use sand or salt on walking paths during winter. Clean up any spills in your garage right away. This includes oil or grease spills. What can I do in the bathroom? Use night lights. Install grab bars by the toilet and in the tub and shower. Do not use towel bars as grab bars. Use non-skid mats or decals in the tub or shower. If you need to sit down in the shower, use a plastic, non-slip stool. Keep the floor dry. Clean up any water that spills on the floor as soon as it happens. Remove soap buildup in the tub or shower regularly. Attach bath mats securely with double-sided non-slip rug tape. Do not have throw rugs and other  things on the floor that can make you trip. What can I do in the bedroom? Use night lights. Make sure that you have a light by your bed that is easy to reach. Do not use any sheets or blankets that are too big for your bed. They should not hang down onto the floor. Have a firm chair that has side arms. You can use this for support while you get dressed. Do not have throw rugs and other things on the floor that can make you trip. What can I do in the kitchen? Clean up any spills right away. Avoid walking on wet floors. Keep items that you use a lot in easy-to-reach places. If you need to reach something above you, use a strong step stool that has a grab bar. Keep electrical cords out of the way. Do not use floor polish or wax that makes floors slippery. If you must use wax, use non-skid floor wax. Do not have throw rugs and other things on the floor that can make you trip. What can I do with my stairs? Do not leave any items on the stairs. Make sure that there are handrails on both sides of the stairs and use them. Fix handrails that are broken or loose. Make sure that handrails are as long as the stairways. Check any carpeting to make sure that it is firmly attached to the stairs. Fix any carpet that is loose or worn. Avoid having  throw rugs at the top or bottom of the stairs. If you do have throw rugs, attach them to the floor with carpet tape. Make sure that you have a light switch at the top of the stairs and the bottom of the stairs. If you do not have them, ask someone to add them for you. What else can I do to help prevent falls? Wear shoes that: Do not have high heels. Have rubber bottoms. Are comfortable and fit you well. Are closed at the toe. Do not wear sandals. If you use a stepladder: Make sure that it is fully opened. Do not climb a closed stepladder. Make sure that both sides of the stepladder are locked into place. Ask someone to hold it for you, if possible. Clearly  mark and make sure that you can see: Any grab bars or handrails. First and last steps. Where the edge of each step is. Use tools that help you move around (mobility aids) if they are needed. These include: Canes. Walkers. Scooters. Crutches. Turn on the lights when you go into a dark area. Replace any light bulbs as soon as they burn out. Set up your furniture so you have a clear path. Avoid moving your furniture around. If any of your floors are uneven, fix them. If there are any pets around you, be aware of where they are. Review your medicines with your doctor. Some medicines can make you feel dizzy. This can increase your chance of falling. Ask your doctor what other things that you can do to help prevent falls. This information is not intended to replace advice given to you by your health care provider. Make sure you discuss any questions you have with your health care provider. Document Released: 02/15/2009 Document Revised: 09/27/2015 Document Reviewed: 05/26/2014 Elsevier Interactive Patient Education  2017 [employer].

## 2021-10-14 NOTE — Progress Notes (Signed)
I connected with [name] today by telephone and verified that I am speaking with the correct person using two identifiers. Location patient: home Location provider: work Persons participating in the virtual visit: Theodor, Crowder LPN.   I discussed the limitations, risks, security and privacy concerns of performing an evaluation and management service by telephone and the availability of in person appointments. I also discussed with the patient that there may be a patient responsible charge related to this service. The patient expressed understanding and verbally consented to this telephonic visit.    Interactive audio and video telecommunications were attempted between this provider and patient, however failed, due to patient having technical difficulties OR patient did not have access to video capability.  We continued and completed visit with audio only.     Vital signs may be patient reported or missing.  Subjective:   [name] is a 66 y.o. male who presents for Medicare Annual/Subsequent preventive examination.  Review of Systems     Cardiac Risk Factors include: advanced age (>56men, >49 women);male gender     Objective:    Today's Vitals   10/14/21 1126  Weight: 180 lb (81.6 kg)  Height: 5\' 9"  (1.753 m)   Body mass index is 26.58 kg/m.     10/14/2021   11:31 AM 10/12/2020   10:00 AM  Advanced Directives  Does Patient Have a Medical Advance Directive? Yes Yes  Type of Paramedic of [place];Living will [place];Living will  Copy of [place] in Chart? No - copy requested No - copy requested    Current Medications (verified) Outpatient Encounter Medications as of 10/14/2021  Medication Sig   diclofenac (VOLTAREN) 75 MG EC tablet TAKE 1 TABLET BY MOUTH TWICE A DAY.*APPOINTMENT REQUIRED FOR FUTURE REFILLS   No facility-administered encounter medications on file as of 10/14/2021.     Allergies (verified) Sulfonamide derivatives   History: Past Medical History:  Diagnosis Date   Knee pain    right   Pneumothorax    small left   Rib fractures    left   Scapula fracture    left   Past Surgical History:  Procedure Laterality Date   COLONOSCOPY  08/2015   normal, repeat in 10 yrs    KNEE SURGERY     right, partial replacement, per  Dr. Gaynelle Arabian   History reviewed. No pertinent family history. Social History   Socioeconomic History   Marital status: Married    Spouse name: Not on file   Number of children: Not on file   Years of education: Not on file   Highest education level: Not on file  Occupational History   Not on file  Tobacco Use   Smoking status: Never   Smokeless tobacco: Never  Substance and Sexual Activity   Alcohol use: Yes    Alcohol/week: 0.0 standard drinks of alcohol    Comment: glass of wine daily   Drug use: No   Sexual activity: Not on file  Other Topics Concern   Not on file  Social History Narrative   Not on file   Social Determinants of Health   Financial Resource Strain: Low Risk  (10/14/2021)   Overall Financial Resource Strain (CARDIA)    Difficulty of Paying Living Expenses: Not hard at all  Food Insecurity: No Food Insecurity (10/14/2021)   Hunger Vital Sign    Worried About Running Out of Food in the Last Year: Never true  Ran Out of Food in the Last Year: Never true  Transportation Needs: No Transportation Needs (10/14/2021)   PRAPARE - Hydrologist (Medical): No    Lack of Transportation (Non-Medical): No  Physical Activity: Sufficiently Active (10/14/2021)   Exercise Vital Sign    Days of Exercise per Week: 7 days    Minutes of Exercise per Session: 60 min  Stress: No Stress Concern Present (10/14/2021)   [place]    Feeling of Stress : Not at all  Social Connections: [place] (10/12/2020)    Social Connection and Isolation Panel [NHANES]    Frequency of Communication with Friends and Family: Twice a week    Frequency of Social Gatherings with Friends and Family: Twice a week    Attends Religious Services: 1 to 4 times per year    Active Member of [employer] or Organizations: Yes    Attends Archivist Meetings: 1 to 4 times per year    Marital Status: Married    Tobacco Counseling Counseling given: Not Answered   Clinical Intake:  Pre-visit preparation completed: Yes  Pain : No/denies pain     Nutritional Status: BMI 25 -29 Overweight Nutritional Risks: None Diabetes: No  How often do you need to have someone help you when you read instructions, pamphlets, or other written materials from your doctor or pharmacy?: 1 - Never What is the last grade level you completed in school?: college  Diabetic? no  Interpreter Needed?: No  Information entered by :: NAllen LPN   Activities of Daily Living    10/14/2021   11:32 AM  In your present state of health, do you have any difficulty performing the following activities:  Hearing? 0  Vision? 0  Difficulty concentrating or making decisions? 0  Walking or climbing stairs? 0  Dressing or bathing? 0  Doing errands, shopping? 0  Preparing Food and eating ? N  Using the Toilet? N  In the past six months, have you accidently leaked urine? N  Do you have problems with loss of bowel control? N  Managing your Medications? N  Managing your Finances? N  Housekeeping or managing your Housekeeping? N    Patient Care Team: Laurey Morale, MD as PCP - General (Family Medicine)  Indicate any recent Medical Services you may have received from other than [facility] providers in the past year (date may be approximate).     Assessment:   This is a routine wellness examination for [name].  Hearing/Vision screen Vision Screening - Comments:: Regular eye exams, [facility]  Dietary issues and exercise activities  discussed: Current Exercise Habits: Home exercise routine, Type of exercise: walking, Time (Minutes): 60, Frequency (Times/Week): 7, Weekly Exercise (Minutes/Week): 420   Goals Addressed             This Visit's Progress    Patient Stated       10/14/2021, stay active and remain independent       Depression Screen    10/14/2021   11:32 AM 05/01/2021    9:52 AM 10/12/2020   10:01 AM 04/30/2020    1:22 PM 03/08/2018    9:11 AM  PHQ 2/9 Scores  PHQ - 2 Score 0 0 0 0 0  PHQ- 9 Score  0  0     Fall Risk    10/14/2021   11:32 AM 05/01/2021    9:52 AM 10/12/2020   10:01 AM  04/30/2020    1:23 PM 03/08/2018    9:11 AM  Fall Risk   Falls in the past year? 0 0 0 0 0  Number falls in past yr: 0 0 0 0   Injury with Fall? 0 0 0 0   Risk for fall due to :  No Fall Risks No Fall Risks    Follow up Falls evaluation completed;Education provided;Falls prevention discussed  Falls evaluation completed      FALL RISK PREVENTION PERTAINING TO THE HOME:  Any stairs in or around the home? Yes  If so, are there any without handrails? No  Home free of loose throw rugs in walkways, pet beds, electrical cords, etc? Yes  Adequate lighting in your home to reduce risk of falls? Yes   ASSISTIVE DEVICES UTILIZED TO PREVENT FALLS:  Life alert? No  Use of a cane, walker or w/c? No  Grab bars in the bathroom? Yes  Shower chair or bench in shower? No  Elevated toilet seat or a handicapped toilet? Yes   TIMED UP AND GO:  Was the test performed? No .       Cognitive Function:        10/14/2021   11:33 AM  6CIT Screen  What Year? 0 points  What month? 0 points  What time? 0 points  Count back from 20 0 points  Months in reverse 0 points  Repeat phrase 0 points  Total Score 0 points    Immunizations Immunization History  Administered Date(s) Administered   Influenza Split 01/13/2012   Influenza,inj,Quad PF,6+ Mos 02/04/2013, 01/03/2014, 01/15/2015, 02/27/2016, 02/27/2017,  03/08/2018, 02/17/2019   Influenza-Unspecified 04/06/2017, 02/27/2021   PFIZER(Purple Top)SARS-COV-2 Vaccination 05/25/2019, 06/15/2019   Pneumococcal Conjugate-13 03/16/2019   Pneumococcal Polysaccharide-23 05/01/2021   Td 05/05/2001   Tdap 01/03/2014   Zoster Recombinat (Shingrix) 02/27/2017, 04/30/2017   Zoster, Live 01/15/2015    TDAP status: Up to date  Flu Vaccine status: Up to date  Pneumococcal vaccine status: Up to date  Covid-19 vaccine status: Completed vaccines  Qualifies for Shingles Vaccine? Yes   Zostavax completed Yes   Shingrix Completed?: Yes  Screening Tests Health Maintenance  Topic Date Due   Hepatitis C Screening  Never done   COVID-19 Vaccine (3 - Pfizer series) 08/10/2019   COLONOSCOPY (Pts 45-18yrs Insurance coverage will need to be confirmed)  05/04/2022 (Originally [birth year]1999)   INFLUENZA VACCINE  12/03/2021   TETANUS/TDAP  01/04/2024   Pneumonia Vaccine 1+ Years old  Completed   Zoster Vaccines- Shingrix  Completed   HPV VACCINES  Aged Out    Health Maintenance  Health Maintenance Due  Topic Date Due   Hepatitis C Screening  Never done   COVID-19 Vaccine (3 - Pfizer series) 08/10/2019    Colorectal cancer screening: states not due yet  Lung Cancer Screening: (Low Dose CT Chest recommended if Age 66-80 years, 30 pack-year currently smoking OR have quit w/in 15years.) does not qualify.   Lung Cancer Screening Referral: no  Additional Screening:  Hepatitis C Screening: does qualify;   Vision Screening: Recommended annual ophthalmology exams for early detection of glaucoma and other disorders of the eye. Is the patient up to date with their annual eye exam?  Yes  Who is the provider or what is the name of the office in which the patient attends annual eye exams? [facility] If pt is not established with a provider, would they like to be referred to a provider to establish care? No .  Dental Screening: Recommended annual dental  exams for proper oral hygiene  Community Resource Referral / Chronic Care Management: CRR required this visit?  No   CCM required this visit?  No      Plan:     I have personally reviewed and noted the following in the patient's chart:   Medical and social history Use of alcohol, tobacco or illicit drugs  Current medications and supplements including opioid prescriptions. Patient is not currently taking opioid prescriptions. Functional ability and status Nutritional status Physical activity Advanced directives List of other physicians Hospitalizations, surgeries, and ER visits in previous 12 months Vitals Screenings to include cognitive, depression, and falls Referrals and appointments  In addition, I have reviewed and discussed with patient certain preventive protocols, quality metrics, and best practice recommendations. A written personalized care plan for preventive services as well as general preventive health recommendations were provided to patient.     Kellie Simmering, LPN   [id number]   Nurse Notes: none  Due to this being a virtual visit, the after visit summary with patients personalized plan was offered to patient via mail or my-chart. Patient would like to access on my-chart

## 2021-11-08 ENCOUNTER — Other Ambulatory Visit: Payer: Self-pay | Admitting: Family Medicine

## 2022-01-10 ENCOUNTER — Ambulatory Visit (INDEPENDENT_AMBULATORY_CARE_PROVIDER_SITE_OTHER): Payer: Medicare HMO

## 2022-01-10 DIAGNOSIS — Z23 Encounter for immunization: Secondary | ICD-10-CM | POA: Diagnosis not present

## 2022-02-12 ENCOUNTER — Other Ambulatory Visit: Payer: Self-pay | Admitting: Family Medicine

## 2022-03-19 DIAGNOSIS — R972 Elevated prostate specific antigen [PSA]: Secondary | ICD-10-CM | POA: Diagnosis not present

## 2022-04-04 DIAGNOSIS — R972 Elevated prostate specific antigen [PSA]: Secondary | ICD-10-CM | POA: Diagnosis not present

## 2022-04-04 DIAGNOSIS — Z125 Encounter for screening for malignant neoplasm of prostate: Secondary | ICD-10-CM | POA: Diagnosis not present

## 2022-04-15 DIAGNOSIS — H5203 Hypermetropia, bilateral: Secondary | ICD-10-CM | POA: Diagnosis not present

## 2022-04-15 DIAGNOSIS — H2513 Age-related nuclear cataract, bilateral: Secondary | ICD-10-CM | POA: Diagnosis not present

## 2022-04-15 DIAGNOSIS — Z135 Encounter for screening for eye and ear disorders: Secondary | ICD-10-CM | POA: Diagnosis not present

## 2022-04-15 DIAGNOSIS — H524 Presbyopia: Secondary | ICD-10-CM | POA: Diagnosis not present

## 2022-04-15 DIAGNOSIS — H52223 Regular astigmatism, bilateral: Secondary | ICD-10-CM | POA: Diagnosis not present

## 2022-05-20 ENCOUNTER — Other Ambulatory Visit: Payer: Self-pay | Admitting: Family Medicine

## 2022-06-10 DIAGNOSIS — D1801 Hemangioma of skin and subcutaneous tissue: Secondary | ICD-10-CM | POA: Diagnosis not present

## 2022-06-10 DIAGNOSIS — D225 Melanocytic nevi of trunk: Secondary | ICD-10-CM | POA: Diagnosis not present

## 2022-06-10 DIAGNOSIS — L570 Actinic keratosis: Secondary | ICD-10-CM | POA: Diagnosis not present

## 2022-06-10 DIAGNOSIS — D2271 Melanocytic nevi of right lower limb, including hip: Secondary | ICD-10-CM | POA: Diagnosis not present

## 2022-06-10 DIAGNOSIS — D2371 Other benign neoplasm of skin of right lower limb, including hip: Secondary | ICD-10-CM | POA: Diagnosis not present

## 2022-06-10 DIAGNOSIS — L821 Other seborrheic keratosis: Secondary | ICD-10-CM | POA: Diagnosis not present

## 2022-06-10 DIAGNOSIS — L814 Other melanin hyperpigmentation: Secondary | ICD-10-CM | POA: Diagnosis not present

## 2022-06-16 ENCOUNTER — Encounter: Payer: Self-pay | Admitting: Family Medicine

## 2022-06-16 ENCOUNTER — Ambulatory Visit (INDEPENDENT_AMBULATORY_CARE_PROVIDER_SITE_OTHER): Payer: Medicare HMO | Admitting: Family Medicine

## 2022-06-16 VITALS — BP 142/88 | HR 84 | Temp 98.1°F | Ht 69.0 in | Wt 191.0 lb

## 2022-06-16 DIAGNOSIS — N138 Other obstructive and reflux uropathy: Secondary | ICD-10-CM | POA: Diagnosis not present

## 2022-06-16 DIAGNOSIS — R739 Hyperglycemia, unspecified: Secondary | ICD-10-CM | POA: Diagnosis not present

## 2022-06-16 DIAGNOSIS — G8929 Other chronic pain: Secondary | ICD-10-CM

## 2022-06-16 DIAGNOSIS — M159 Polyosteoarthritis, unspecified: Secondary | ICD-10-CM | POA: Diagnosis not present

## 2022-06-16 DIAGNOSIS — E785 Hyperlipidemia, unspecified: Secondary | ICD-10-CM | POA: Diagnosis not present

## 2022-06-16 DIAGNOSIS — I499 Cardiac arrhythmia, unspecified: Secondary | ICD-10-CM | POA: Diagnosis not present

## 2022-06-16 DIAGNOSIS — M25511 Pain in right shoulder: Secondary | ICD-10-CM

## 2022-06-16 DIAGNOSIS — M1990 Unspecified osteoarthritis, unspecified site: Secondary | ICD-10-CM | POA: Insufficient documentation

## 2022-06-16 DIAGNOSIS — N401 Enlarged prostate with lower urinary tract symptoms: Secondary | ICD-10-CM | POA: Diagnosis not present

## 2022-06-16 LAB — CBC WITH DIFFERENTIAL/PLATELET
Basophils Absolute: 0 10*3/uL (ref 0.0–0.1)
Basophils Relative: 0.6 % (ref 0.0–3.0)
Eosinophils Absolute: 0.1 10*3/uL (ref 0.0–0.7)
Eosinophils Relative: 1.7 % (ref 0.0–5.0)
HCT: 45.8 % (ref 39.0–52.0)
Hemoglobin: 15.7 g/dL (ref 13.0–17.0)
Lymphocytes Relative: 40 % (ref 12.0–46.0)
Lymphs Abs: 2.5 10*3/uL (ref 0.7–4.0)
MCHC: 34.2 g/dL (ref 30.0–36.0)
MCV: 95.6 fl (ref 78.0–100.0)
Monocytes Absolute: 0.8 10*3/uL (ref 0.1–1.0)
Monocytes Relative: 12.9 % — ABNORMAL HIGH (ref 3.0–12.0)
Neutro Abs: 2.8 10*3/uL (ref 1.4–7.7)
Neutrophils Relative %: 44.8 % (ref 43.0–77.0)
Platelets: 180 10*3/uL (ref 150.0–400.0)
RBC: 4.79 Mil/uL (ref 4.22–5.81)
RDW: 12.9 % (ref 11.5–15.5)
WBC: 6.4 10*3/uL (ref 4.0–10.5)

## 2022-06-16 LAB — BASIC METABOLIC PANEL
BUN: 16 mg/dL (ref 6–23)
CO2: 29 mEq/L (ref 19–32)
Calcium: 9.2 mg/dL (ref 8.4–10.5)
Chloride: 102 mEq/L (ref 96–112)
Creatinine, Ser: 0.92 mg/dL (ref 0.40–1.50)
GFR: 85.61 mL/min (ref >60.00)
Glucose, Bld: 101 mg/dL — ABNORMAL HIGH (ref 70–99)
Potassium: 4.7 mEq/L (ref 3.5–5.1)
Sodium: 138 mEq/L (ref 135–145)

## 2022-06-16 LAB — LIPID PANEL
Cholesterol: 184 mg/dL (ref 0–200)
HDL: 51.4 mg/dL (ref >39.00)
LDL Cholesterol: 117 mg/dL — ABNORMAL HIGH (ref 0–99)
NonHDL: 132.99
Total CHOL/HDL Ratio: 4
Triglycerides: 81 mg/dL (ref 0.0–149.0)
VLDL: 16.2 mg/dL (ref 0.0–40.0)

## 2022-06-16 LAB — HEPATIC FUNCTION PANEL
ALT: 24 U/L (ref 0–53)
AST: 17 U/L (ref 0–37)
Albumin: 4.2 g/dL (ref 3.5–5.2)
Alkaline Phosphatase: 61 U/L (ref 39–117)
Bilirubin, Direct: 0.2 mg/dL (ref 0.0–0.3)
Total Bilirubin: 0.8 mg/dL (ref 0.2–1.2)
Total Protein: 6.7 g/dL (ref 6.0–8.3)

## 2022-06-16 LAB — TSH: TSH: 2.22 u[IU]/mL (ref 0.35–5.50)

## 2022-06-16 LAB — PSA: PSA: 5.07 ng/mL — ABNORMAL HIGH (ref 0.10–4.00)

## 2022-06-16 LAB — HEMOGLOBIN A1C: Hgb A1c MFr Bld: 5.4 % (ref 4.6–6.5)

## 2022-06-16 MED ORDER — CELECOXIB 200 MG PO CAPS: 200.0000 mg | ORAL_CAPSULE | Freq: Two times a day (BID) | ORAL | 3 refills | Status: DC

## 2022-06-16 NOTE — Progress Notes (Signed)
Subjective:    Patient ID: [name], male    DOB: [birth year], 67 y.o.   MRN: [id number]  HPI Here to follow up on issues. He feels well in general except for joint pains, primarily in the right shoulder and the left knee. He has been taking Diclofenac for years, but this has not been working as well as it used to. Last year on his well exam, we found his PSA to be elevated to 7.82. we referred him to see Urology, and he saw Dr. Gloriann Loan. His exam was normal, and the PSA had dropped back down to 2.9. He was told to follow up with [place]. He has no pain or fever.    Review of Systems  Constitutional: Negative.   HENT: Negative.    Eyes: Negative.   Respiratory: Negative.    Cardiovascular: Negative.   Gastrointestinal: Negative.   Genitourinary: Negative.   Musculoskeletal:  Positive for arthralgias.  Skin: Negative.   Neurological: Negative.   Psychiatric/Behavioral: Negative.         Objective:   Physical Exam Constitutional:      General: He is not in acute distress.    Appearance: Normal appearance. He is well-developed. He is not diaphoretic.  HENT:     Head: Normocephalic and atraumatic.     Right Ear: External ear normal.     Left Ear: External ear normal.     Nose: Nose normal.     Mouth/Throat:     Pharynx: No oropharyngeal exudate.  Eyes:     General: No scleral icterus.       Right eye: No discharge.        Left eye: No discharge.     Conjunctiva/sclera: Conjunctivae normal.     Pupils: Pupils are equal, round, and reactive to light.  Neck:     Thyroid: No thyromegaly.     Vascular: No JVD.     Trachea: No tracheal deviation.  Cardiovascular:     Rate and Rhythm: Normal rate. Rhythm irregular.     Pulses: Normal pulses.     Heart sounds: Normal heart sounds. No murmur heard.    No friction rub. No gallop.     Comments: EKG today shows sinus rhythm with occasional PAC's  Pulmonary:     Effort: Pulmonary effort is normal. No respiratory distress.     Breath  sounds: Normal breath sounds. No wheezing or rales.  Chest:     Chest wall: No tenderness.  Abdominal:     General: Bowel sounds are normal. There is no distension.     Palpations: Abdomen is soft. There is no mass.     Tenderness: There is no abdominal tenderness. There is no guarding or rebound.  Genitourinary:    Penis: Normal. No tenderness.      Testes: Normal.     Prostate: Normal.     Rectum: Normal. Guaiac result negative.  Musculoskeletal:        General: No tenderness. Normal range of motion.     Cervical back: Neck supple.  Lymphadenopathy:     Cervical: No cervical adenopathy.  Skin:    General: Skin is warm and dry.     Coloration: Skin is not pale.     Findings: No erythema or rash.  Neurological:     Mental Status: He is alert and oriented to person, place, and time.     Cranial Nerves: No cranial nerve deficit.     Motor: No abnormal muscle tone.  Coordination: Coordination normal.     Deep Tendon Reflexes: Reflexes are normal and symmetric. Reflexes normal.  Psychiatric:        Behavior: Behavior normal.        Thought Content: Thought content normal.        Judgment: Judgment normal.           Assessment & Plan:  He is doing well except for his OA. We will stop the Diclofenac and try Celebrex 200 mg BID. He has some PAC's this morning, but I suspect this is the result of drinking six cups of coffee this morning. He will return if he ever feels palpitations or racing. Get fasting labs to check lipids, etc. Recheck a PSA. We spent a total of ( 33  ) minutes reviewing records and discussing these issues.  Alysia Penna, MD

## 2022-06-17 ENCOUNTER — Other Ambulatory Visit: Payer: Self-pay | Admitting: Family Medicine

## 2022-07-03 DIAGNOSIS — M25562 Pain in left knee: Secondary | ICD-10-CM | POA: Diagnosis not present

## 2022-09-30 ENCOUNTER — Telehealth: Payer: Self-pay | Admitting: Family Medicine

## 2022-09-30 NOTE — Telephone Encounter (Signed)
Wants to change from celecoxib (CELEBREX) 200 MG capsule to diclofenac (VOLTAREN) 75 MG EC tablet   [id number] [place], [place] - [address] Phone: [phone]  Fax: [phone]

## 2022-10-01 MED ORDER — DICLOFENAC SODIUM 75 MG PO TBEC: 75.0000 mg | DELAYED_RELEASE_TABLET | Freq: Two times a day (BID) | ORAL | 0 refills | Status: DC

## 2022-10-01 NOTE — Addendum Note (Signed)
Addended by: Nancy Fetter on: 10/01/2022 01:17 PM   Modules accepted: Orders

## 2022-10-01 NOTE — Telephone Encounter (Signed)
Left a message for pt regarding this

## 2022-10-03 ENCOUNTER — Ambulatory Visit (INDEPENDENT_AMBULATORY_CARE_PROVIDER_SITE_OTHER): Payer: Medicare HMO

## 2022-10-03 VITALS — Ht 69.0 in | Wt 191.0 lb

## 2022-10-03 DIAGNOSIS — Z0000 Encounter for general adult medical examination without abnormal findings: Secondary | ICD-10-CM

## 2022-10-03 NOTE — Progress Notes (Signed)
Subjective:   [name] is a 67 y.o. male who presents for Medicare Annual/Subsequent preventive examination.  Review of Systems    Virtual Visit via Telephone Note  I connected with  [name] on 10/03/22 at  1:00 PM EDT by telephone and verified that I am speaking with the correct person using two identifiers.  Location: Patient: Home Provider: Office Persons participating in the virtual visit: patient/Nurse Health Advisor   I discussed the limitations, risks, security and privacy concerns of performing an evaluation and management service by telephone and the availability of in person appointments. The patient expressed understanding and agreed to proceed.  Interactive audio and video telecommunications were attempted between this nurse and patient, however failed, due to patient having technical difficulties OR patient did not have access to video capability.  We continued and completed visit with audio only.  Some vital signs may be absent or patient reported.   Tillie Rung, LPN  Cardiac Risk Factors include: advanced age (>42men, >44 women);male gender     Objective:    Today's Vitals   10/03/22 1307  Weight: 191 lb (86.6 kg)  Height: 5\' 9"  (1.753 m)   Body mass index is 28.21 kg/m.     10/03/2022    1:15 PM 10/14/2021   11:31 AM 10/12/2020   10:00 AM  Advanced Directives  Does Patient Have a Medical Advance Directive? Yes Yes Yes  Type of Estate agent of [place];Living will Healthcare Power of [place];Living will Healthcare Power of [place];Living will  Copy of Healthcare Power of Attorney in Chart? No - copy requested No - copy requested No - copy requested    Current Medications (verified) Outpatient Encounter Medications as of 10/03/2022  Medication Sig   diclofenac (VOLTAREN) 75 MG EC tablet Take 1 tablet (75 mg total) by mouth 2 (two) times daily.   No facility-administered encounter medications on file as of  10/03/2022.    Allergies (verified) Sulfonamide derivatives   History: Past Medical History:  Diagnosis Date   Knee pain    right   Pneumothorax    small left   Rib fractures    left   Scapula fracture    left   Past Surgical History:  Procedure Laterality Date   COLONOSCOPY  08/2015   normal, repeat in 10 yrs    KNEE SURGERY     right, partial replacement, per  Dr. Ollen Gross   History reviewed. No pertinent family history. Social History   Socioeconomic History   Marital status: Married    Spouse name: Not on file   Number of children: Not on file   Years of education: Not on file   Highest education level: Not on file  Occupational History   Not on file  Tobacco Use   Smoking status: Never   Smokeless tobacco: Never  Substance and Sexual Activity   Alcohol use: Yes    Alcohol/week: 0.0 standard drinks of alcohol    Comment: glass of wine daily   Drug use: No   Sexual activity: Not on file  Other Topics Concern   Not on file  Social History Narrative   Not on file   Social Determinants of Health   Financial Resource Strain: Low Risk  (10/03/2022)   Overall Financial Resource Strain (CARDIA)    Difficulty of Paying Living Expenses: Not hard at all  Food Insecurity: No Food Insecurity (10/03/2022)   Hunger Vital Sign    Worried About Running  Out of Food in the Last Year: Never true    Ran Out of Food in the Last Year: Never true  Transportation Needs: No Transportation Needs (10/03/2022)   PRAPARE - Administrator, Civil Service (Medical): No    Lack of Transportation (Non-Medical): No  Physical Activity: Insufficiently Active (10/03/2022)   Exercise Vital Sign    Days of Exercise per Week: 3 days    Minutes of Exercise per Session: 30 min  Stress: No Stress Concern Present (10/03/2022)   [employer] of Occupational Health - Occupational Stress Questionnaire    Feeling of Stress : Not at all  Social Connections: Socially Integrated  (10/03/2022)   Social Connection and Isolation Panel [NHANES]    Frequency of Communication with Friends and Family: More than three times a week    Frequency of Social Gatherings with Friends and Family: More than three times a week    Attends Religious Services: More than 4 times per year    Active Member of [employer] or Organizations: Yes    Attends Engineer, structural: More than 4 times per year    Marital Status: Married    Tobacco Counseling Counseling given: Not Answered   Clinical Intake:  Pre-visit preparation completed: No  Pain : No/denies pain     BMI - recorded: 28.21 Nutritional Status: BMI 25 -29 Overweight Nutritional Risks: None Diabetes: No  How often do you need to have someone help you when you read instructions, pamphlets, or other written materials from your doctor or pharmacy?: 1 - Never  Diabetic? No  Interpreter Needed?: No  Information entered by :: Theresa Mulligan LPN   Activities of Daily Living    10/03/2022    1:13 PM 10/14/2021   11:32 AM  In your present state of health, do you have any difficulty performing the following activities:  Hearing? 0 0  Vision? 0 0  Difficulty concentrating or making decisions? 0 0  Walking or climbing stairs? 0 0  Dressing or bathing? 0 0  Doing errands, shopping? 0 0  Preparing Food and eating ? N N  Using the Toilet? N N  In the past six months, have you accidently leaked urine? N N  Do you have problems with loss of bowel control? N N  Managing your Medications? N N  Managing your Finances? N N  Housekeeping or managing your Housekeeping? N N    Patient Care Team: Nelwyn Salisbury, MD as PCP - General (Family Medicine)  Indicate any recent Medical Services you may have received from other than [facility] providers in the past year (date may be approximate).     Assessment:   This is a routine wellness examination for [name].  Hearing/Vision screen Hearing Screening - Comments:: Denies hearing  difficulties   Vision Screening - Comments:: Wears rx glasses - up to date with routine eye exams with  Dr Harriette Bouillon  Dietary issues and exercise activities discussed: Current Exercise Habits: Home exercise routine, Type of exercise: walking, Time (Minutes): 30, Frequency (Times/Week): 3, Weekly Exercise (Minutes/Week): 90, Intensity: Moderate, Exercise limited by: None identified   Goals Addressed               This Visit's Progress     Stay Active (pt-stated)         Depression Screen    10/03/2022    1:12 PM 10/14/2021   11:32 AM 05/01/2021    9:52 AM 10/12/2020   10:01 AM 04/30/2020  1:22 PM 03/08/2018    9:11 AM  PHQ 2/9 Scores  PHQ - 2 Score 0 0 0 0 0 0  PHQ- 9 Score   0  0     Fall Risk    10/03/2022    1:13 PM 10/14/2021   11:32 AM 05/01/2021    9:52 AM 10/12/2020   10:01 AM 04/30/2020    1:23 PM  Fall Risk   Falls in the past year? 1 0 0 0 0  Number falls in past yr: 0 0 0 0 0  Injury with Fall? 0 0 0 0 0  Risk for fall due to : No Fall Risks  No Fall Risks No Fall Risks   Follow up Falls prevention discussed Falls evaluation completed;Education provided;Falls prevention discussed  Falls evaluation completed     FALL RISK PREVENTION PERTAINING TO THE HOME:  Any stairs in or around the home? Yes  If so, are there any without handrails? No  Home free of loose throw rugs in walkways, pet beds, electrical cords, etc? Yes  Adequate lighting in your home to reduce risk of falls? Yes   ASSISTIVE DEVICES UTILIZED TO PREVENT FALLS:  Life alert? No  Use of a cane, walker or w/c? No  Grab bars in the bathroom? Yes  Shower chair or bench in shower? Yes Elevated toilet seat or a handicapped toilet? Yes  TIMED UP AND GO:  Was the test performed? No . Audio Visit  Cognitive Function:        10/03/2022    1:15 PM 10/14/2021   11:33 AM  6CIT Screen  What Year? 0 points 0 points  What month? 0 points 0 points  What time? 0 points 0 points  Count back from  20 0 points 0 points  Months in reverse 0 points 0 points  Repeat phrase 0 points 0 points  Total Score 0 points 0 points    Immunizations Immunization History  Administered Date(s) Administered   Fluad Quad(high Dose 65+) 01/10/2022   Influenza Split 01/13/2012   Influenza,inj,Quad PF,6+ Mos 02/04/2013, 01/03/2014, 01/15/2015, 02/27/2016, 02/27/2017, 03/08/2018, 02/17/2019   Influenza-Unspecified 04/06/2017, 02/27/2021   PFIZER(Purple Top)SARS-COV-2 Vaccination 05/25/2019, 06/15/2019   Pfizer Covid-19 Vaccine Bivalent Booster 52yrs & up 08/25/2020, 01/15/2021   Pneumococcal Conjugate-13 03/16/2019   Pneumococcal Polysaccharide-23 05/01/2021   Td 05/05/2001   Tdap 01/03/2014   Zoster Recombinat (Shingrix) 02/27/2017, 04/30/2017   Zoster, Live 01/15/2015    TDAP status: Up to date  Flu Vaccine status: Up to date  Pneumococcal vaccine status: Up to date  Covid-19 vaccine status: Completed vaccines  Qualifies for Shingles Vaccine? Yes   Zostavax completed Yes   Shingrix Completed?: Yes  Screening Tests Health Maintenance  Topic Date Due   COVID-19 Vaccine (5 - 2023-24 season) 10/19/2022 (Originally 01/03/2022)   Colonoscopy  10/03/2023 (Originally 03/05/1999)   Hepatitis C Screening  10/03/2023 (Originally [birth year]1972)   INFLUENZA VACCINE  12/04/2022   Medicare Annual Wellness (AWV)  10/03/2023   DTaP/Tdap/Td (3 - Td or Tdap) 01/04/2024   Pneumonia Vaccine 53+ Years old  Completed   Zoster Vaccines- Shingrix  Completed   HPV VACCINES  Aged Out    Health Maintenance  There are no preventive care reminders to display for this patient.   Colorectal cancer screening: Referral to GI placed Deferred. Pt aware the office will call re: appt.  Lung Cancer Screening: (Low Dose CT Chest recommended if Age 26-80 years, 30 pack-year currently smoking OR have quit w/in 15years.)  does not qualify.     Additional Screening:  Hepatitis C Screening: does qualify; Completed  Deferred  Vision Screening: Recommended annual ophthalmology exams for early detection of glaucoma and other disorders of the eye. Is the patient up to date with their annual eye exam?  Yes  Who is the provider or what is the name of the office in which the patient attends annual eye exams? Dr Harriette Bouillon If pt is not established with a provider, would they like to be referred to a provider to establish care? No .   Dental Screening: Recommended annual dental exams for proper oral hygiene  Community Resource Referral / Chronic Care Management:  CRR required this visit?  No   CCM required this visit?  No      Plan:     I have personally reviewed and noted the following in the patient's chart:   Medical and social history Use of alcohol, tobacco or illicit drugs  Current medications and supplements including opioid prescriptions. Patient is not currently taking opioid prescriptions. Functional ability and status Nutritional status Physical activity Advanced directives List of other physicians Hospitalizations, surgeries, and ER visits in previous 12 months Vitals Screenings to include cognitive, depression, and falls Referrals and appointments  In addition, I have reviewed and discussed with patient certain preventive protocols, quality metrics, and best practice recommendations. A written personalized care plan for preventive services as well as general preventive health recommendations were provided to patient.     Tillie Rung, LPN   1/61/0960   Nurse Notes: Patient due Hep-C Screening

## 2022-10-03 NOTE — Patient Instructions (Addendum)
Mr. [name] , Thank you for taking time to come for your Medicare Wellness Visit. I appreciate your ongoing commitment to your health goals. Please review the following plan we discussed and let me know if I can assist you in the future.   These are the goals we discussed:  Goals       Exercise 3x per week (30 min per time)      Patient Stated      10/14/2021, stay active and remain independent      Stay Active (pt-stated)        This is a list of the screening recommended for you and due dates:  Health Maintenance  Topic Date Due   COVID-19 Vaccine (5 - 2023-24 season) 10/19/2022*   Colon Cancer Screening  10/03/2023*   Hepatitis C Screening  10/03/2023*   Flu Shot  12/04/2022   Medicare Annual Wellness Visit  10/03/2023   DTaP/Tdap/Td vaccine (3 - Td or Tdap) 01/04/2024   Pneumonia Vaccine  Completed   Zoster (Shingles) Vaccine  Completed   HPV Vaccine  Aged Out  *Topic was postponed. The date shown is not the original due date.    Advanced directives: Please bring a copy of your health care power of attorney and living will to the office to be added to your chart at your convenience.   Conditions/risks identified: None  Next appointment: Follow up in one year for your annual wellness visit.    Preventive Care 75 Years and Older, Male  Preventive care refers to lifestyle choices and visits with your health care provider that can promote health and wellness. What does preventive care include? A yearly physical exam. This is also called an annual well check. Dental exams once or twice a year. Routine eye exams. Ask your health care provider how often you should have your eyes checked. Personal lifestyle choices, including: Daily care of your teeth and gums. Regular physical activity. Eating a healthy diet. Avoiding tobacco and drug use. Limiting alcohol use. Practicing safe sex. Taking low doses of aspirin every day. Taking vitamin and mineral supplements as  recommended by your health care provider. What happens during an annual well check? The services and screenings done by your health care provider during your annual well check will depend on your age, overall health, lifestyle risk factors, and family history of disease. Counseling  Your health care provider may ask you questions about your: Alcohol use. Tobacco use. Drug use. Emotional well-being. Home and relationship well-being. Sexual activity. Eating habits. History of falls. Memory and ability to understand (cognition). Work and work Astronomer. Screening  You may have the following tests or measurements: Height, weight, and BMI. Blood pressure. Lipid and cholesterol levels. These may be checked every 5 years, or more frequently if you are over 83 years old. Skin check. Lung cancer screening. You may have this screening every year starting at age 39 if you have a 30-pack-year history of smoking and currently smoke or have quit within the past 15 years. Fecal occult blood test (FOBT) of the stool. You may have this test every year starting at age 54. Flexible sigmoidoscopy or colonoscopy. You may have a sigmoidoscopy every 5 years or a colonoscopy every 10 years starting at age 82. Prostate cancer screening. Recommendations will vary depending on your family history and other risks. Hepatitis C blood test. Hepatitis B blood test. Sexually transmitted disease (STD) testing. Diabetes screening. This is done by checking your blood sugar (glucose) after you have  not eaten for a while (fasting). You may have this done every 1-3 years. Abdominal aortic aneurysm (AAA) screening. You may need this if you are a current or former smoker. Osteoporosis. You may be screened starting at age 34 if you are at high risk. Talk with your health care provider about your test results, treatment options, and if necessary, the need for more tests. Vaccines  Your health care provider may recommend  certain vaccines, such as: Influenza vaccine. This is recommended every year. Tetanus, diphtheria, and acellular pertussis (Tdap, Td) vaccine. You may need a Td booster every 10 years. Zoster vaccine. You may need this after age 1. Pneumococcal 13-valent conjugate (PCV13) vaccine. One dose is recommended after age 68. Pneumococcal polysaccharide (PPSV23) vaccine. One dose is recommended after age 51. Talk to your health care provider about which screenings and vaccines you need and how often you need them. This information is not intended to replace advice given to you by your health care provider. Make sure you discuss any questions you have with your health care provider. Document Released: 05/18/2015 Document Revised: 01/09/2016 Document Reviewed: 02/20/2015 Elsevier Interactive Patient Education  2017 [employer].  Fall Prevention in the Home Falls can cause injuries. They can happen to people of all ages. There are many things you can do to make your home safe and to help prevent falls. What can I do on the outside of my home? Regularly fix the edges of walkways and driveways and fix any cracks. Remove anything that might make you trip as you walk through a door, such as a raised step or threshold. Trim any bushes or trees on the path to your home. Use bright outdoor lighting. Clear any walking paths of anything that might make someone trip, such as rocks or tools. Regularly check to see if handrails are loose or broken. Make sure that both sides of any steps have handrails. Any raised decks and porches should have guardrails on the edges. Have any leaves, snow, or ice cleared regularly. Use sand or salt on walking paths during winter. Clean up any spills in your garage right away. This includes oil or grease spills. What can I do in the bathroom? Use night lights. Install grab bars by the toilet and in the tub and shower. Do not use towel bars as grab bars. Use non-skid mats or  decals in the tub or shower. If you need to sit down in the shower, use a plastic, non-slip stool. Keep the floor dry. Clean up any water that spills on the floor as soon as it happens. Remove soap buildup in the tub or shower regularly. Attach bath mats securely with double-sided non-slip rug tape. Do not have throw rugs and other things on the floor that can make you trip. What can I do in the bedroom? Use night lights. Make sure that you have a light by your bed that is easy to reach. Do not use any sheets or blankets that are too big for your bed. They should not hang down onto the floor. Have a firm chair that has side arms. You can use this for support while you get dressed. Do not have throw rugs and other things on the floor that can make you trip. What can I do in the kitchen? Clean up any spills right away. Avoid walking on wet floors. Keep items that you use a lot in easy-to-reach places. If you need to reach something above you, use a strong step stool  that has a grab bar. Keep electrical cords out of the way. Do not use floor polish or wax that makes floors slippery. If you must use wax, use non-skid floor wax. Do not have throw rugs and other things on the floor that can make you trip. What can I do with my stairs? Do not leave any items on the stairs. Make sure that there are handrails on both sides of the stairs and use them. Fix handrails that are broken or loose. Make sure that handrails are as long as the stairways. Check any carpeting to make sure that it is firmly attached to the stairs. Fix any carpet that is loose or worn. Avoid having throw rugs at the top or bottom of the stairs. If you do have throw rugs, attach them to the floor with carpet tape. Make sure that you have a light switch at the top of the stairs and the bottom of the stairs. If you do not have them, ask someone to add them for you. What else can I do to help prevent falls? Wear shoes that: Do not  have high heels. Have rubber bottoms. Are comfortable and fit you well. Are closed at the toe. Do not wear sandals. If you use a stepladder: Make sure that it is fully opened. Do not climb a closed stepladder. Make sure that both sides of the stepladder are locked into place. Ask someone to hold it for you, if possible. Clearly mark and make sure that you can see: Any grab bars or handrails. First and last steps. Where the edge of each step is. Use tools that help you move around (mobility aids) if they are needed. These include: Canes. Walkers. Scooters. Crutches. Turn on the lights when you go into a dark area. Replace any light bulbs as soon as they burn out. Set up your furniture so you have a clear path. Avoid moving your furniture around. If any of your floors are uneven, fix them. If there are any pets around you, be aware of where they are. Review your medicines with your doctor. Some medicines can make you feel dizzy. This can increase your chance of falling. Ask your doctor what other things that you can do to help prevent falls. This information is not intended to replace advice given to you by your health care provider. Make sure you discuss any questions you have with your health care provider. Document Released: 02/15/2009 Document Revised: 09/27/2015 Document Reviewed: 05/26/2014 Elsevier Interactive Patient Education  2017 [employer].

## 2022-10-28 ENCOUNTER — Other Ambulatory Visit: Payer: Self-pay | Admitting: Adult Health

## 2022-10-28 NOTE — Telephone Encounter (Signed)
Per Dr. Claris Che annotation " We will stop the Diclofenac and try Celebrex 200 mg BID." Rx denied

## 2022-11-07 NOTE — Telephone Encounter (Signed)
Pt states he spoke with someone on Dr Claris Che team and they agreed to discontinue celebrex and restart the diclofenac after patient tried celebrex for 1 month and says it was not as effective. Requesting refill of diclofenac (VOLTAREN) 75 MG EC tablet

## 2022-11-12 ENCOUNTER — Telehealth: Payer: Self-pay | Admitting: Family Medicine

## 2022-11-12 NOTE — Telephone Encounter (Signed)
Prescription Request  11/12/2022  LOV: 06/16/2022  What is the name of the medication or equipment? diclofenac (VOLTAREN) 75 MG EC tablet   Have you contacted your pharmacy to request a refill? No   Which pharmacy would you like this sent to?  [id number] [place], [place] - [address] [place] [address] Phone: [phone] Fax: [phone]    Patient notified that their request is being sent to the clinical staff for review and that they should receive a response within 2 business days.   Please advise at Mobile [phone] (mobile)

## 2022-11-14 MED ORDER — DICLOFENAC SODIUM 75 MG PO TBEC: 75.0000 mg | DELAYED_RELEASE_TABLET | Freq: Two times a day (BID) | ORAL | 0 refills | Status: DC

## 2022-11-14 NOTE — Telephone Encounter (Signed)
Rx refilled.

## 2022-12-11 ENCOUNTER — Other Ambulatory Visit: Payer: Self-pay | Admitting: Family Medicine

## 2023-01-08 ENCOUNTER — Other Ambulatory Visit: Payer: Self-pay | Admitting: Family Medicine

## 2023-02-05 ENCOUNTER — Other Ambulatory Visit: Payer: Self-pay | Admitting: Family Medicine

## 2023-02-06 ENCOUNTER — Other Ambulatory Visit: Payer: Self-pay | Admitting: Family Medicine

## 2023-02-06 DIAGNOSIS — Z1212 Encounter for screening for malignant neoplasm of rectum: Secondary | ICD-10-CM

## 2023-02-06 DIAGNOSIS — Z1211 Encounter for screening for malignant neoplasm of colon: Secondary | ICD-10-CM

## 2023-02-23 DIAGNOSIS — Z1212 Encounter for screening for malignant neoplasm of rectum: Secondary | ICD-10-CM | POA: Diagnosis not present

## 2023-02-23 DIAGNOSIS — Z1211 Encounter for screening for malignant neoplasm of colon: Secondary | ICD-10-CM | POA: Diagnosis not present

## 2023-03-01 LAB — COLOGUARD: COLOGUARD: NEGATIVE

## 2023-03-06 ENCOUNTER — Other Ambulatory Visit: Payer: Self-pay | Admitting: Family Medicine

## 2023-04-05 ENCOUNTER — Other Ambulatory Visit: Payer: Self-pay | Admitting: Family Medicine

## 2023-04-30 DIAGNOSIS — H5203 Hypermetropia, bilateral: Secondary | ICD-10-CM | POA: Diagnosis not present

## 2023-04-30 DIAGNOSIS — Z135 Encounter for screening for eye and ear disorders: Secondary | ICD-10-CM | POA: Diagnosis not present

## 2023-04-30 DIAGNOSIS — H2513 Age-related nuclear cataract, bilateral: Secondary | ICD-10-CM | POA: Diagnosis not present

## 2023-04-30 DIAGNOSIS — H52223 Regular astigmatism, bilateral: Secondary | ICD-10-CM | POA: Diagnosis not present

## 2023-04-30 DIAGNOSIS — H524 Presbyopia: Secondary | ICD-10-CM | POA: Diagnosis not present

## 2023-05-06 ENCOUNTER — Other Ambulatory Visit: Payer: Self-pay | Admitting: Family Medicine

## 2023-06-09 ENCOUNTER — Other Ambulatory Visit: Payer: Self-pay | Admitting: Family Medicine

## 2023-06-16 DIAGNOSIS — D1801 Hemangioma of skin and subcutaneous tissue: Secondary | ICD-10-CM | POA: Diagnosis not present

## 2023-06-16 DIAGNOSIS — L821 Other seborrheic keratosis: Secondary | ICD-10-CM | POA: Diagnosis not present

## 2023-06-16 DIAGNOSIS — D2271 Melanocytic nevi of right lower limb, including hip: Secondary | ICD-10-CM | POA: Diagnosis not present

## 2023-06-16 DIAGNOSIS — D2371 Other benign neoplasm of skin of right lower limb, including hip: Secondary | ICD-10-CM | POA: Diagnosis not present

## 2023-06-16 DIAGNOSIS — D225 Melanocytic nevi of trunk: Secondary | ICD-10-CM | POA: Diagnosis not present

## 2023-06-16 DIAGNOSIS — L570 Actinic keratosis: Secondary | ICD-10-CM | POA: Diagnosis not present

## 2023-06-16 DIAGNOSIS — L814 Other melanin hyperpigmentation: Secondary | ICD-10-CM | POA: Diagnosis not present

## 2023-06-18 ENCOUNTER — Encounter: Payer: Medicare HMO | Admitting: Family Medicine

## 2023-06-22 ENCOUNTER — Encounter: Payer: Self-pay | Admitting: Family Medicine

## 2023-06-22 ENCOUNTER — Ambulatory Visit (INDEPENDENT_AMBULATORY_CARE_PROVIDER_SITE_OTHER): Payer: Medicare HMO | Admitting: Family Medicine

## 2023-06-22 VITALS — BP 118/80 | HR 75 | Temp 97.8°F | Ht 69.0 in | Wt 188.0 lb

## 2023-06-22 DIAGNOSIS — M25511 Pain in right shoulder: Secondary | ICD-10-CM | POA: Diagnosis not present

## 2023-06-22 DIAGNOSIS — E785 Hyperlipidemia, unspecified: Secondary | ICD-10-CM | POA: Diagnosis not present

## 2023-06-22 DIAGNOSIS — M150 Primary generalized (osteo)arthritis: Secondary | ICD-10-CM

## 2023-06-22 DIAGNOSIS — J301 Allergic rhinitis due to pollen: Secondary | ICD-10-CM | POA: Diagnosis not present

## 2023-06-22 DIAGNOSIS — R972 Elevated prostate specific antigen [PSA]: Secondary | ICD-10-CM | POA: Diagnosis not present

## 2023-06-22 DIAGNOSIS — G8929 Other chronic pain: Secondary | ICD-10-CM | POA: Diagnosis not present

## 2023-06-22 DIAGNOSIS — R739 Hyperglycemia, unspecified: Secondary | ICD-10-CM | POA: Diagnosis not present

## 2023-06-22 LAB — CBC WITH DIFFERENTIAL/PLATELET
Basophils Absolute: 0 10*3/uL (ref 0.0–0.1)
Basophils Relative: 0.9 % (ref 0.0–3.0)
Eosinophils Absolute: 0.1 10*3/uL (ref 0.0–0.7)
Eosinophils Relative: 1.6 % (ref 0.0–5.0)
HCT: 44.1 % (ref 39.0–52.0)
Hemoglobin: 15.1 g/dL (ref 13.0–17.0)
Lymphocytes Relative: 40 % (ref 12.0–46.0)
Lymphs Abs: 1.9 10*3/uL (ref 0.7–4.0)
MCHC: 34.2 g/dL (ref 30.0–36.0)
MCV: 95.1 fL (ref 78.0–100.0)
Monocytes Absolute: 0.7 10*3/uL (ref 0.1–1.0)
Monocytes Relative: 13.6 % — ABNORMAL HIGH (ref 3.0–12.0)
Neutro Abs: 2.1 10*3/uL (ref 1.4–7.7)
Neutrophils Relative %: 43.9 % (ref 43.0–77.0)
Platelets: 172 10*3/uL (ref 150.0–400.0)
RBC: 4.64 Mil/uL (ref 4.22–5.81)
RDW: 12.8 % (ref 11.5–15.5)
WBC: 4.9 10*3/uL (ref 4.0–10.5)

## 2023-06-22 LAB — TSH: TSH: 2.44 u[IU]/mL (ref 0.35–5.50)

## 2023-06-22 LAB — HEPATIC FUNCTION PANEL
ALT: 21 U/L (ref 0–53)
AST: 17 U/L (ref 0–37)
Albumin: 4.2 g/dL (ref 3.5–5.2)
Alkaline Phosphatase: 59 U/L (ref 39–117)
Bilirubin, Direct: 0.1 mg/dL (ref 0.0–0.3)
Total Bilirubin: 0.6 mg/dL (ref 0.2–1.2)
Total Protein: 7 g/dL (ref 6.0–8.3)

## 2023-06-22 LAB — BASIC METABOLIC PANEL
BUN: 16 mg/dL (ref 6–23)
CO2: 28 meq/L (ref 19–32)
Calcium: 8.9 mg/dL (ref 8.4–10.5)
Chloride: 103 meq/L (ref 96–112)
Creatinine, Ser: 0.81 mg/dL (ref 0.40–1.50)
GFR: 90.09 mL/min (ref >60.00)
Glucose, Bld: 114 mg/dL — ABNORMAL HIGH (ref 70–99)
Potassium: 4.5 meq/L (ref 3.5–5.1)
Sodium: 138 meq/L (ref 135–145)

## 2023-06-22 LAB — LIPID PANEL
Cholesterol: 182 mg/dL (ref 0–200)
HDL: 50.9 mg/dL (ref >39.00)
LDL Cholesterol: 118 mg/dL — ABNORMAL HIGH (ref 0–99)
NonHDL: 131.52
Total CHOL/HDL Ratio: 4
Triglycerides: 66 mg/dL (ref 0.0–149.0)
VLDL: 13.2 mg/dL (ref 0.0–40.0)

## 2023-06-22 LAB — HEMOGLOBIN A1C: Hgb A1c MFr Bld: 5.5 % (ref 4.6–6.5)

## 2023-06-22 MED ORDER — DICLOFENAC SODIUM 75 MG PO TBEC: 75.0000 mg | DELAYED_RELEASE_TABLET | Freq: Two times a day (BID) | ORAL | 3 refills | Status: AC

## 2023-06-22 NOTE — Progress Notes (Signed)
 Subjective:    Patient ID: [name], male    DOB: 1955-12-03, 68 y.o.   MRN: [id number]  HPI Here to follow up on issues. He feel swell other than his OA. He has chronic right shoulder pain, and he is seeing Dr. Malon Kindle for this. He will need replacement surgery at some point. He sees Dr. Alvester Morin for elevated PSA levels.    Review of Systems  Constitutional: Negative.   HENT: Negative.    Eyes: Negative.   Respiratory: Negative.    Cardiovascular: Negative.   Gastrointestinal: Negative.   Genitourinary: Negative.   Musculoskeletal:  Positive for arthralgias.  Skin: Negative.   Neurological: Negative.   Psychiatric/Behavioral: Negative.         Objective:   Physical Exam Constitutional:      General: He is not in acute distress.    Appearance: Normal appearance. He is well-developed. He is not diaphoretic.  HENT:     Head: Normocephalic and atraumatic.     Right Ear: External ear normal.     Left Ear: External ear normal.     Nose: Nose normal.     Mouth/Throat:     Pharynx: No oropharyngeal exudate.  Eyes:     General: No scleral icterus.       Right eye: No discharge.        Left eye: No discharge.     Conjunctiva/sclera: Conjunctivae normal.     Pupils: Pupils are equal, round, and reactive to light.  Neck:     Thyroid: No thyromegaly.     Vascular: No JVD.     Trachea: No tracheal deviation.  Cardiovascular:     Rate and Rhythm: Normal rate and regular rhythm.     Pulses: Normal pulses.     Heart sounds: Normal heart sounds. No murmur heard.    No friction rub. No gallop.  Pulmonary:     Effort: Pulmonary effort is normal. No respiratory distress.     Breath sounds: Normal breath sounds. No wheezing or rales.  Chest:     Chest wall: No tenderness.  Abdominal:     General: Bowel sounds are normal. There is no distension.     Palpations: Abdomen is soft. There is no mass.     Tenderness: There is no abdominal tenderness. There is no guarding or  rebound.  Genitourinary:    Penis: No tenderness.   Musculoskeletal:        General: No tenderness. Normal range of motion.     Cervical back: Neck supple.  Lymphadenopathy:     Cervical: No cervical adenopathy.  Skin:    General: Skin is warm and dry.     Coloration: Skin is not pale.     Findings: No erythema or rash.  Neurological:     General: No focal deficit present.     Mental Status: He is alert and oriented to person, place, and time.     Cranial Nerves: No cranial nerve deficit.     Motor: No abnormal muscle tone.     Coordination: Coordination normal.     Deep Tendon Reflexes: Reflexes are normal and symmetric. Reflexes normal.  Psychiatric:        Mood and Affect: Mood normal.        Behavior: Behavior normal.        Thought Content: Thought content normal.        Judgment: Judgment normal.  Assessment & Plan:  He is doing well overall. He will follow up with Dr. Ranell Patrick for the right shoulder pain. He has decided to put up with the pain as long as he can before going to surgery. He will follow up with Dr. Alvester Morin for the PSA. His OA is stable. Get fasting labs to check lipids, etc. We spent a total of ( 32  ) minutes reviewing records and discussing these issues.  Gershon Crane, MD

## 2023-07-01 ENCOUNTER — Encounter: Payer: Self-pay | Admitting: *Deleted

## 2023-08-17 ENCOUNTER — Encounter: Payer: Medicare HMO | Admitting: Family Medicine

## 2023-08-17 ENCOUNTER — Ambulatory Visit: Payer: Medicare HMO

## 2023-10-06 ENCOUNTER — Encounter: Payer: Medicare HMO | Admitting: Family Medicine

## 2023-10-09 ENCOUNTER — Telehealth: Payer: Self-pay

## 2023-10-09 NOTE — Telephone Encounter (Signed)
 Unsuccessful attempts to reach patient on preferred number listed in notes for scheduled AWV. Left message on voicemail okay to reschedule.

## 2024-06-22 ENCOUNTER — Encounter: Payer: Medicare HMO | Admitting: Family Medicine

## 2024-06-29 ENCOUNTER — Encounter: Admitting: Family Medicine
